# Patient Record
Sex: Male | Born: 1945
Health system: Southern US, Community
[De-identification: ages and names within clinical notes are randomized; demographics above are authoritative.]

## PROBLEM LIST (undated history)

## (undated) DIAGNOSIS — K219 Gastro-esophageal reflux disease without esophagitis: Secondary | ICD-10-CM

## (undated) DIAGNOSIS — H9190 Unspecified hearing loss, unspecified ear: Secondary | ICD-10-CM

## (undated) DIAGNOSIS — K449 Diaphragmatic hernia without obstruction or gangrene: Secondary | ICD-10-CM

## (undated) DIAGNOSIS — I10 Essential (primary) hypertension: Secondary | ICD-10-CM

## (undated) DIAGNOSIS — D509 Iron deficiency anemia, unspecified: Secondary | ICD-10-CM

## (undated) DIAGNOSIS — C61 Malignant neoplasm of prostate: Secondary | ICD-10-CM

## (undated) DIAGNOSIS — E119 Type 2 diabetes mellitus without complications: Secondary | ICD-10-CM

## (undated) DIAGNOSIS — E785 Hyperlipidemia, unspecified: Secondary | ICD-10-CM

## (undated) DIAGNOSIS — K2270 Barrett's esophagus without dysplasia: Secondary | ICD-10-CM

## (undated) HISTORY — DX: Essential (primary) hypertension: I10

## (undated) HISTORY — DX: Unspecified hearing loss, unspecified ear: H91.90

## (undated) HISTORY — DX: Iron deficiency anemia, unspecified: D50.9

## (undated) HISTORY — PX: PROSTATE SURGERY: SHX751

## (undated) HISTORY — DX: Barrett's esophagus without dysplasia: K22.70

## (undated) HISTORY — DX: Diaphragmatic hernia without obstruction or gangrene: K44.9

## (undated) HISTORY — DX: Type 2 diabetes mellitus without complications: E11.9

## (undated) HISTORY — DX: Malignant neoplasm of prostate: C61

## (undated) HISTORY — DX: Hyperlipidemia, unspecified: E78.5

## (undated) HISTORY — PX: COLONOSCOPY: SHX174

---

## 2005-01-19 ENCOUNTER — Ambulatory Visit (HOSPITAL_COMMUNITY): Admission: RE | Admit: 2005-01-19 | Discharge: 2005-01-19 | Payer: Self-pay | Admitting: Urology

## 2005-04-26 ENCOUNTER — Ambulatory Visit (HOSPITAL_COMMUNITY): Admission: RE | Admit: 2005-04-26 | Discharge: 2005-04-26 | Payer: Self-pay | Admitting: Urology

## 2006-04-30 ENCOUNTER — Ambulatory Visit (HOSPITAL_COMMUNITY): Admission: RE | Admit: 2006-04-30 | Discharge: 2006-04-30 | Payer: Self-pay | Admitting: Urology

## 2006-06-13 ENCOUNTER — Encounter: Payer: Self-pay | Admitting: Gastroenterology

## 2006-09-27 ENCOUNTER — Encounter (HOSPITAL_COMMUNITY): Admission: RE | Admit: 2006-09-27 | Discharge: 2006-09-27 | Payer: Self-pay | Admitting: Urology

## 2006-10-30 ENCOUNTER — Encounter (HOSPITAL_COMMUNITY): Admission: RE | Admit: 2006-10-30 | Discharge: 2007-01-15 | Payer: Self-pay | Admitting: Urology

## 2007-05-28 ENCOUNTER — Ambulatory Visit (HOSPITAL_BASED_OUTPATIENT_CLINIC_OR_DEPARTMENT_OTHER): Admission: RE | Admit: 2007-05-28 | Discharge: 2007-05-28 | Payer: Self-pay | Admitting: Urology

## 2008-02-27 ENCOUNTER — Encounter (HOSPITAL_COMMUNITY): Admission: RE | Admit: 2008-02-27 | Discharge: 2008-05-27 | Payer: Self-pay | Admitting: Urology

## 2008-04-28 IMAGING — CT NM TUMOR LOCAL/TRACER DISTR WHOLE BODY 2+ DAYS
1 series · 14 of 16 positions shown, 18 images · non-contrast
Comparison: Comparison is made with bone scan 09/27/06.

CLINICAL DATA: Rising PSA.
NM PROSTATE TUMOR LOCALIZATION (SINGLE DAY PROSTASCINT IMAGING) WITH SPECT:
TECHNIQUE: 5.1 mCi of 4ndium-HHH labeled ProstaScint was administered intravenously. Routine bowel preparation was performed prior to imaging. On day 5, whole-body static imaging and SPECT imaging of the pelvis in the coronal, sagittal and axial planes was performed. A nondiagnostic CT scan was obtained for anatomic localization only of the abdomen and pelvis. The SPECT images were then fused with CT data using [employer] Stamps software.

[Series 2: ct images · axial · 0.98mm/px · z∈[-412,-29]mm · 14 of 135 slices shown, 18 images]
[im 9/135  soft-tissue]
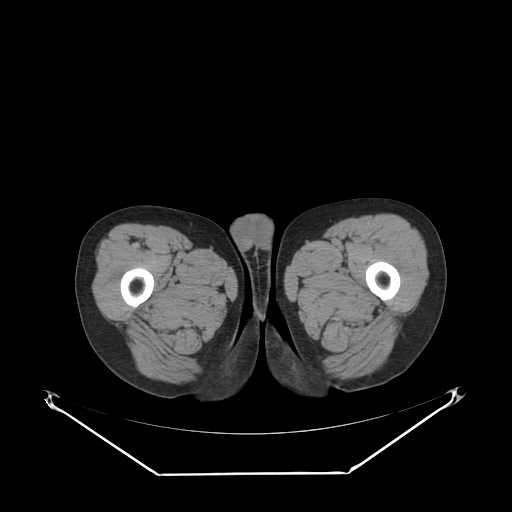
[im 9/135  bone]
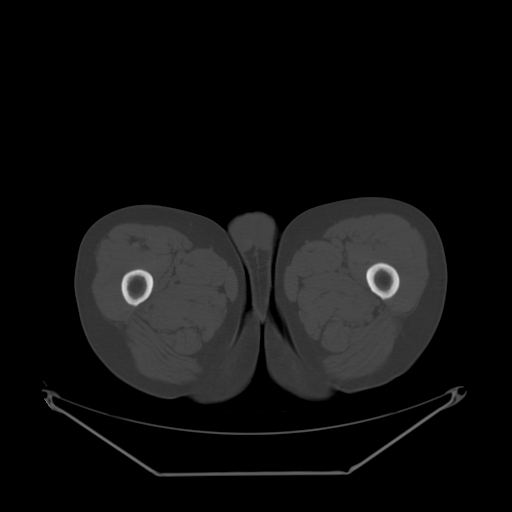
[im 18/135  soft-tissue]
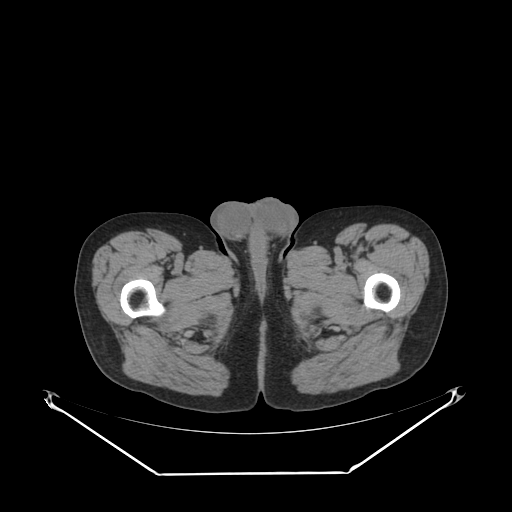
[im 27/135  soft-tissue]
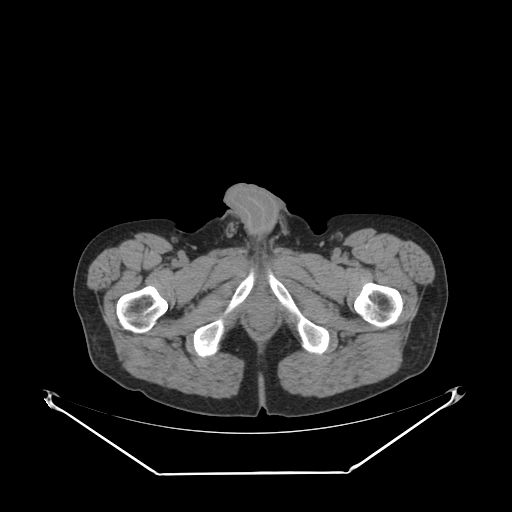
[im 36/135  soft-tissue]
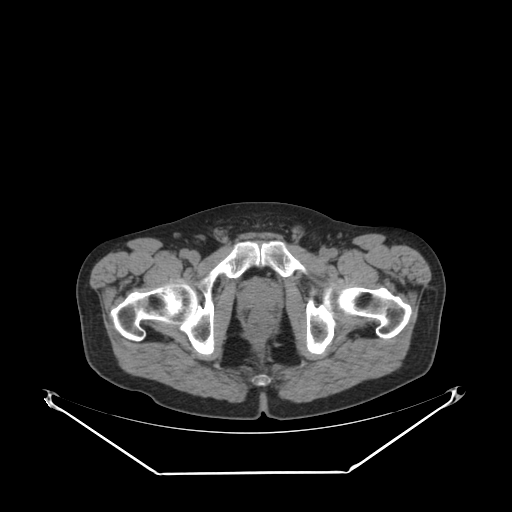
[im 45/135  soft-tissue]
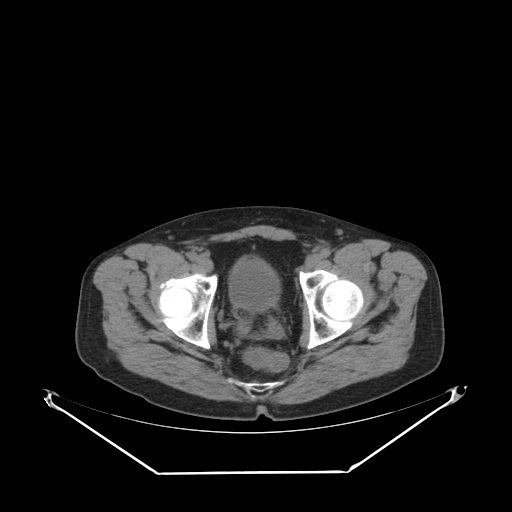
[im 45/135  bone]
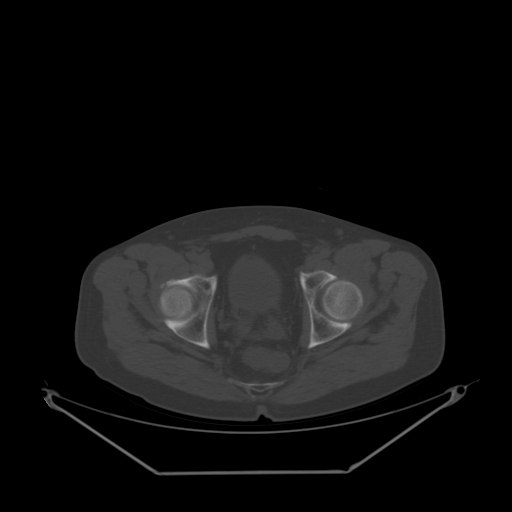
[im 54/135  soft-tissue]
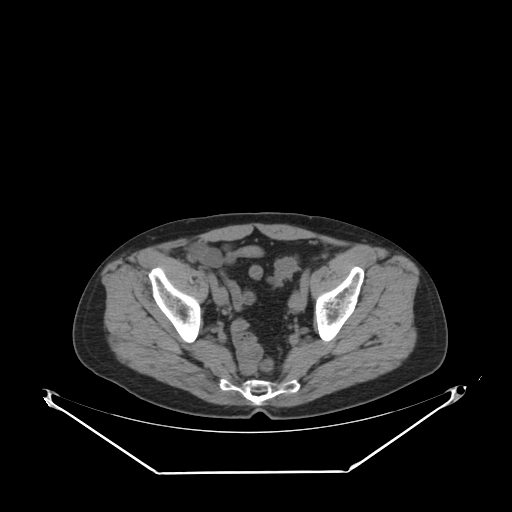
[im 63/135  soft-tissue]
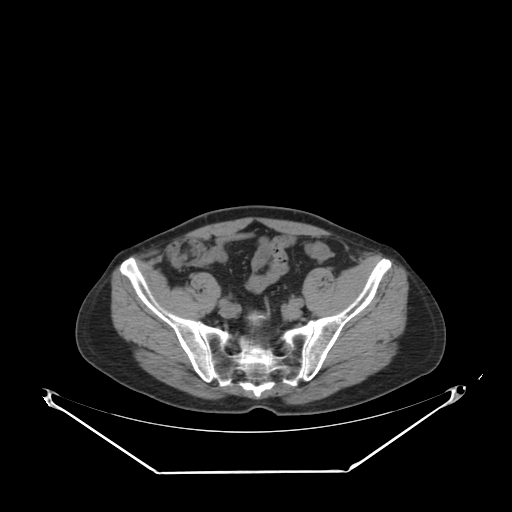
[im 72/135  soft-tissue]
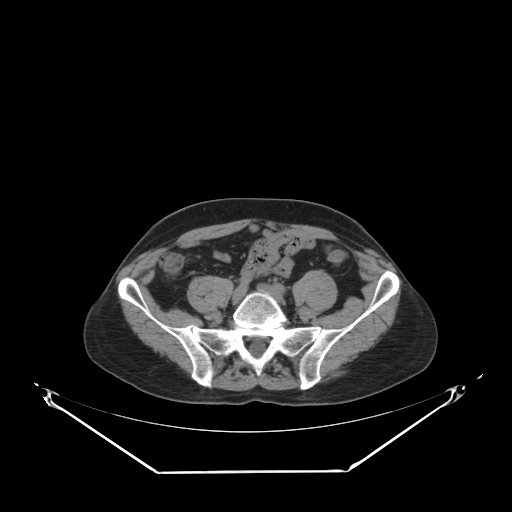
[im 81/135  soft-tissue]
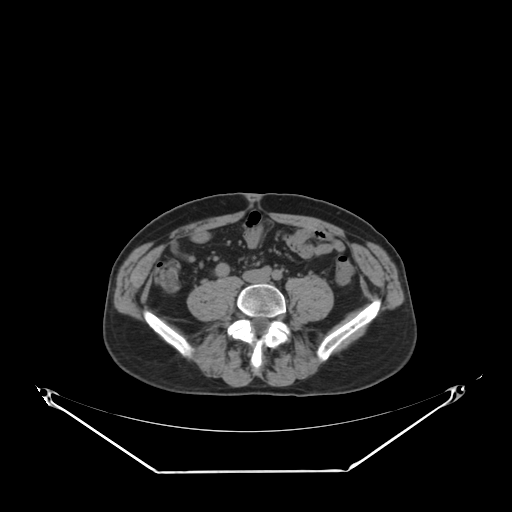
[im 81/135  bone]
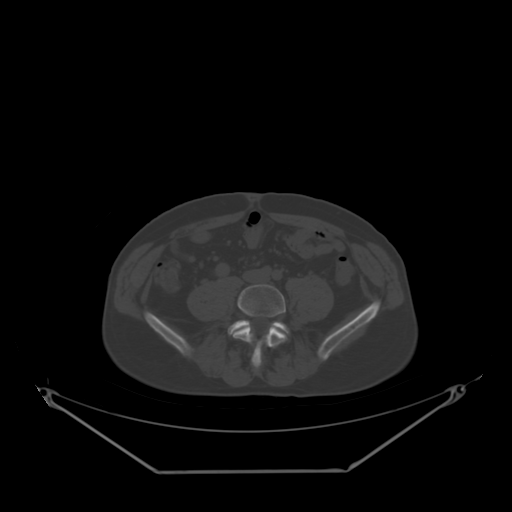
[im 90/135  soft-tissue]
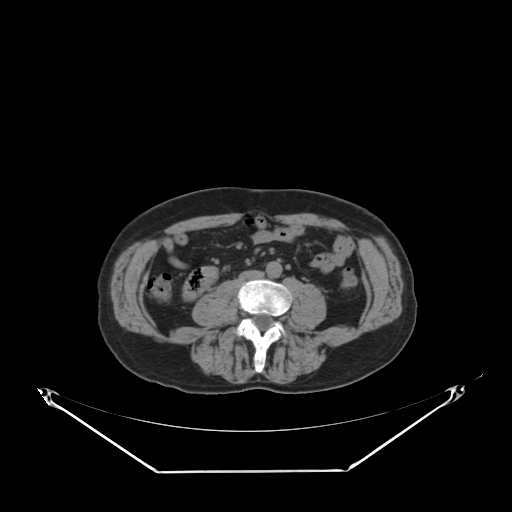
[im 99/135  soft-tissue]
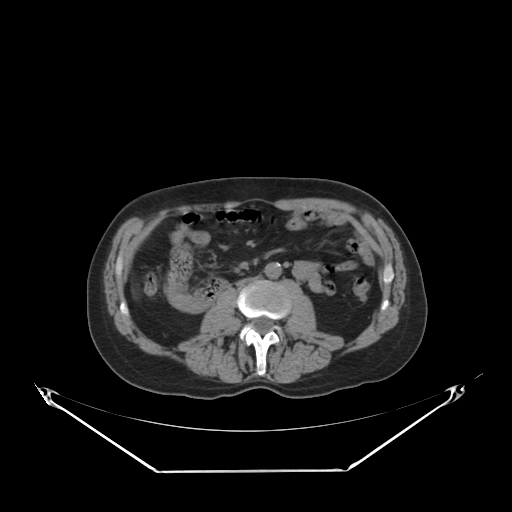
[im 108/135  soft-tissue]
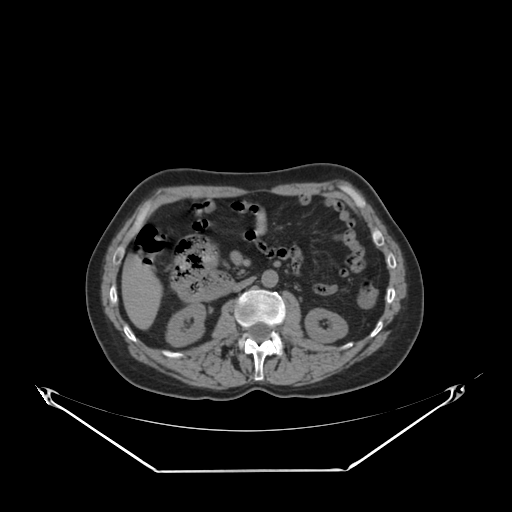
[im 117/135  soft-tissue]
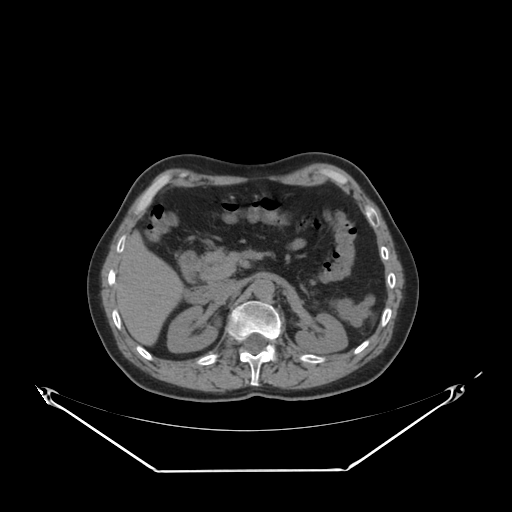
[im 117/135  bone]
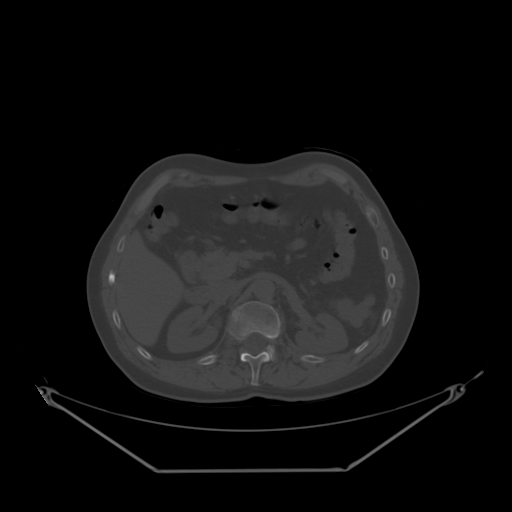
[im 126/135  soft-tissue]
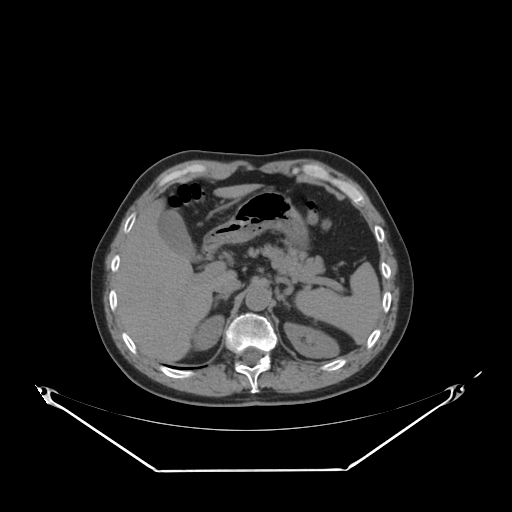

[14 of 16 positions shown; findings below may reference images not displayed]

FINDINGS: The noncontrast CT portion of the examination performed for anatomic localization was reviewed. There is a low-density lesion in the right lobe of liver, which is nonspecific and too small to characterize. There is no pathologically enlarged retroperitoneal lymph nodes. No small bowel mesenteric lymphadenopathy is noted. No enlarged pelvic lymph nodes are identified. There are subcentimeter bilateral inguinal lymph nodes, for example, a left inguinal lymph node has a fatty hilum measuring 9.6 x 10.1 mm. The bone windows shows no suspicious lytic or sclerotic lesions. 
The anterior and posterior whole body nuclear medicine planar images show normal physiologic distribution of the radiotracer. 
There is mild increased radiotracer activity within the prostate gland and base of penis. There is no evidence for extraprostatic sites of Prostascint activity.
IMPRESSION: No extraprostatic sites of Prostascint activity to suggest Prostascint avid metastasis.

## 2009-09-14 ENCOUNTER — Ambulatory Visit: Payer: Self-pay | Admitting: Gastroenterology

## 2009-09-14 ENCOUNTER — Encounter (INDEPENDENT_AMBULATORY_CARE_PROVIDER_SITE_OTHER): Payer: Self-pay | Admitting: *Deleted

## 2009-09-14 DIAGNOSIS — K2270 Barrett's esophagus without dysplasia: Secondary | ICD-10-CM | POA: Insufficient documentation

## 2009-09-28 ENCOUNTER — Ambulatory Visit: Payer: Self-pay | Admitting: Gastroenterology

## 2009-10-04 ENCOUNTER — Encounter: Payer: Self-pay | Admitting: Gastroenterology

## 2010-11-26 ENCOUNTER — Encounter: Payer: Self-pay | Admitting: Urology

## 2011-03-20 NOTE — Op Note (Signed)
[name], [name]               ACCOUNT NO.:  [id number]   MEDICAL RECORD NO.:  [id number]          PATIENT TYPE:  AMB   LOCATION:  [facility]                         FACILITY:  [facility]   PHYSICIAN:  Lindaann Slough, M.D.  DATE OF BIRTH:  05/09/1946   DATE OF PROCEDURE:  05/28/2007  DATE OF DISCHARGE:                               OPERATIVE REPORT   PREOPERATIVE DIAGNOSIS:  Adenocarcinoma of prostate, status post  cryoablation of the prostate.   POSTOPERATIVE DIAGNOSES:  Adenocarcinoma of prostate, status post  cryoablation of the prostate.   PROCEDURE:  Cystoscopy and repeat cryoablation of the prostate.   SURGEON:  Danae Chen, M.D.   ANESTHESIA:  General.   INDICATIONS:  The patient is a 65 year old male who had cryoablation of  the prostate on April 26, 2005.  PSA at the time of diagnosis was 8.38  and went down to 2.46 in December 2006 and it has since been slowly  going up.  Prostascint scan December 2007 showed no evidence of  extraprostatic activities.  Bone scan was negative for metastatic  disease.  Repeat prostate biopsy on February 28, 2007 showed adenocarcinoma  of the prostate, Gleason score 4 + 4.  The patient received an injection  of Lupron and treatment options were discussed again with him.  Those  options include repeat cryoablation, combination external beam and  brachytherapy or hormonal treatment.  He chose to have repeat  cryoablation.  He is scheduled today for the procedure.   DESCRIPTION OF PROCEDURE:  Under general anesthesia the patient was  prepped and draped and placed in the dorsal lithotomy position.  A #16  Foley catheter was inserted in the bladder and then the transducer was  inserted in the rectum.  The volume of the prostate is about 16 mL.  The  grid was then attached to the transducer then five ice rods were placed  in the prostate, one at the C2, another one at E2 level.  The other  three were placed, one at the C1-1/2, D1-1/2 posteriorly and  one at E1-  1/2.  Two sensor needles were placed, one at the external sphincter and  another one in Denonvilliers' fascia.  Then the Foley catheter was  removed.  A flexible cystoscope was inserted in the bladder.  The  anterior urethra is normal.  There is moderate prostatic hypertrophy.  The bladder is moderately trabeculated.  There is no stone or tumor in  the bladder.  There is no evidence of penetration of the bladder neck  with the ice rods.  Then under direct vision a suprapubic catheter was  inserted in the bladder.  Then the guide wire was passed through the  cystoscope into the bladder and the cystoscope was removed.  The  urethral warmer catheter was passed over the guidewire and the guidewire  was removed and the urethral warmer catheter was secured with towel  clips.  Then the first freeze cycle was started on the anterior needle,  then the ice rods at C1-1/2 and were activated when the ice ball was  close to those needles.  Then  the middle posterior ice rod was then  activated.  The first freeze cycle lasted about 6 minutes and 30-  seconds.  The temperature at the external sphincter was 16 degrees  centigrade and temperature in Denonvilliers fascia was -6 degrees  centigrade.  The temperature of the urethral warmer catheter remained at  43 degrees centigrade.  A nice ice ball was formed throughout the  prostate gland.  Then the thaw cycle was started and lasted about 10  minutes.  Then the second freeze cycle was started at the anterior ice  rods.  Then the posterior ice rods were activated.  The second freeze  cycle lasted 4 minutes and 30 seconds.  The temperature at the external  sphincter was at 16 degrees centigrade and the temperature at the  Denonvilliers fascia was at -20 degrees centigrade.  Again a nice ice  ball was formed throughout the prostate gland and at that point an  active thaw cycle was started.  Then all ice rods were removed and the  sensor needles  were also removed.  The  suprapubic catheter was connected to a drainage bag.  The urethral  warmer catheter was removed after passive thaw cycle about 30 minutes at  the end of the procedure.   The patient tolerated the procedure well and left the OR in satisfactory  condition to post anesthesia care unit.      Lindaann Slough, M.D.  Electronically Signed     MN/MEDQ  D:  05/28/2007  T:  05/29/2007  Job:  [id number]

## 2011-03-23 NOTE — Op Note (Signed)
[name], [name]               ACCOUNT NO.:  [id number]   MEDICAL RECORD NO.:  [id number]          PATIENT TYPE:  AMB   LOCATION:  DAY                          FACILITY:  [facility]   PHYSICIAN:  Lindaann Slough, M.D.  DATE OF BIRTH:  Apr 14, 1946   DATE OF PROCEDURE:  04/26/2005  DATE OF DISCHARGE:                                 OPERATIVE REPORT   PREOPERATIVE DIAGNOSIS:  Adenocarcinoma of prostate, stage T1C.   POSTOPERATIVE DIAGNOSIS:  Adenocarcinoma of prostate, stage T1C.   PROCEDURE:  Cystoscopy, insertion of suprapubic catheter and cryoablation of  prostate.   SURGEON:  Dr. Brunilda Payor.   ANESTHESIA:  General.   INDICATIONS:  The patient is 65 year old male who had an elevated PSA at  8.37. Prostate biopsy was positive for adenocarcinoma, Gleason score 4 +3  (7). Bone scan is negative for metastatic disease. Treatment options were  discussed with the patient and he chose to have cryoablation of the  prostate.  He is scheduled today for the procedure.   Under general anesthesia the patient was prepped and draped and placed in  the dorsolithotomy position. A flexible  cystoscope was inserted in the  bladder. The patient has trilobar prostatic hypertrophy. The bladder mucosa  is normal. There is no stone or tumor in the bladder. The ureteral orifices  are in normal position and shape with clear reflux. Then a #14-French  Microvasive suprapubic catheter was inserted in the bladder. The cystoscope  was then removed and a #16-French Foley catheter was then inserted in the  bladder. Ultrasound  probe was then placed on the stepperdevice. The bladder neck, the urethra  and prostate were well visualized on both sagittal and transverse planes.   Two ice rods were placed on the anterior prostate at C3 and E3 then a second  row of ice rods were placed c2 and E2. Then a third ice rod was placed c1.5  and a fourth one was placed at e1. Then a thermal sensor was placed on the  right  neurovascular bundle and another thermal sensor was placed on the left  neurovascular bundle. The thermal sensor was placed in the Denonvillier's  fascia at the mid prostate in the midline and another sensor was placed at  the area of the urethral sphincter posterior to the urethra. The needles  were visualized on both sagittal and transverse planes.   Foley catheter was then removed. The flexible cystoscope was then reinserted  in the bladder. There was no evidence of urethral or bladder penetration  with the needles and the sphincter could be seen winking from the sphincter  probe. We felt all needles were then in good position. Then a super stiff  Cook wire was passed through the cystoscope into the bladder. The cystoscope  was then removed and the urethral warmer catheter was passed over the guide  wire.   The first freeze cycle was then started on the first row at a flow rate of  100%. An ice ball was then visualized. And then in subsequent fashion row 2,  3, and 4 were then started. The first freeze  time for group one was 10  minutes and eight and half minutes for the second group and 3 minutes for  the third group and 3 minutes for the fourth group. There was good freezing  of the prostate. The temperature at the external sphincter was -11C but it  is felt that the needle was more into the prostate than at the external  sphincter.  The temperature at the right neurovascular bundle was 19.  The  Denonvillier's fascia 6 degrees and 21 for the left neurovascular bundle.  Then an active thaw  was performed for 10 minutes.  Then the second freeze  cycle was started beginning with the first group.  The freeze time was six  minutes and 30 seconds for the groups 1 and 2 and three minutes for groups 3  and 4. The temperature at the right neurovascular bundle was 26 degrees, 24  at the left neurovascular bundle, 4 at Denonvillier's fascia and 0 at the  external sphincter.  Following this, a  second thaw was then performed for 10  minutes.  And then all needles were removed.  The urethral warmer catheter  was left in place for 40 minutes.  Pressure was held over the perineum, was  held for 10 minutes.  Then a pressure dressing was applied on the perineum.   The patient tolerated the procedure well and left the OR in satisfactory  condition to post anesthesia care unit.       MN/MEDQ  D:  04/26/2005  T:  04/26/2005  Job:  [id number]

## 2011-07-06 ENCOUNTER — Other Ambulatory Visit (HOSPITAL_COMMUNITY): Payer: Self-pay | Admitting: Urology

## 2011-07-06 DIAGNOSIS — C61 Malignant neoplasm of prostate: Secondary | ICD-10-CM

## 2011-07-24 ENCOUNTER — Ambulatory Visit (HOSPITAL_COMMUNITY)
Admission: RE | Admit: 2011-07-24 | Discharge: 2011-07-24 | Disposition: A | Discharge status: Home / Self Care | Payer: Medicare Other | Source: Ambulatory Visit | Attending: Urology | Admitting: Urology

## 2011-07-24 ENCOUNTER — Encounter (HOSPITAL_COMMUNITY)
Admission: RE | Admit: 2011-07-24 | Discharge: 2011-07-24 | Disposition: A | Discharge status: Home / Self Care | Payer: Medicare Other | Source: Ambulatory Visit | Attending: Urology | Admitting: Urology

## 2011-07-24 DIAGNOSIS — C61 Malignant neoplasm of prostate: Secondary | ICD-10-CM

## 2011-07-24 MED ORDER — TECHNETIUM TC 99M MEDRONATE IV KIT
25.4000 | PACK | Freq: Once | INTRAVENOUS | Status: AC | PRN
  Administered 2011-07-24: 25 via INTRAVENOUS

## 2011-08-20 LAB — COMPREHENSIVE METABOLIC PANEL
ALT: 35
Alkaline Phosphatase: 73
BUN: 12
GFR calc Af Amer: >60
GFR calc non Af Amer: >60
Glucose, Bld: 96
Potassium: 3.8
Total Bilirubin: 1.2

## 2011-08-20 LAB — CBC
HCT: 44.2
Platelets: 182

## 2011-08-20 LAB — PROTIME-INR
INR: 1
Prothrombin Time: 13

## 2012-03-05 ENCOUNTER — Other Ambulatory Visit (HOSPITAL_COMMUNITY): Payer: Self-pay | Admitting: Urology

## 2012-03-05 DIAGNOSIS — C61 Malignant neoplasm of prostate: Secondary | ICD-10-CM

## 2012-03-14 ENCOUNTER — Other Ambulatory Visit (HOSPITAL_COMMUNITY): Payer: Medicare Other

## 2012-03-17 ENCOUNTER — Encounter (HOSPITAL_COMMUNITY)
Admission: RE | Admit: 2012-03-17 | Discharge: 2012-03-17 | Disposition: A | Discharge status: Home / Self Care | Payer: Medicare Other | Source: Ambulatory Visit | Attending: Urology | Admitting: Urology

## 2012-03-17 DIAGNOSIS — C61 Malignant neoplasm of prostate: Secondary | ICD-10-CM | POA: Insufficient documentation

## 2012-03-17 DIAGNOSIS — Z006 Encounter for examination for normal comparison and control in clinical research program: Secondary | ICD-10-CM | POA: Insufficient documentation

## 2012-03-17 MED ORDER — FLUDEOXYGLUCOSE F - 18 (FDG) INJECTION: 9.9000 | Freq: Once | INTRAVENOUS | Status: AC | PRN

## 2012-08-18 ENCOUNTER — Encounter: Payer: Self-pay | Admitting: Gastroenterology

## 2012-11-14 ENCOUNTER — Other Ambulatory Visit (HOSPITAL_COMMUNITY): Payer: Self-pay | Admitting: Urology

## 2012-11-14 DIAGNOSIS — C61 Malignant neoplasm of prostate: Secondary | ICD-10-CM

## 2012-11-25 ENCOUNTER — Ambulatory Visit: Payer: Self-pay

## 2012-11-25 DIAGNOSIS — Z139 Encounter for screening, unspecified: Secondary | ICD-10-CM

## 2012-11-25 NOTE — Progress Notes (Signed)
[facility] EKG 

## 2012-11-26 ENCOUNTER — Encounter (HOSPITAL_COMMUNITY)
Admission: RE | Admit: 2012-11-26 | Discharge: 2012-11-26 | Disposition: A | Discharge status: Home / Self Care | Payer: Medicare PPO | Source: Ambulatory Visit | Attending: Urology | Admitting: Urology

## 2012-11-26 ENCOUNTER — Other Ambulatory Visit (HOSPITAL_COMMUNITY): Payer: Self-pay | Admitting: Urology

## 2012-11-26 DIAGNOSIS — Z006 Encounter for examination for normal comparison and control in clinical research program: Secondary | ICD-10-CM | POA: Insufficient documentation

## 2012-11-26 DIAGNOSIS — C61 Malignant neoplasm of prostate: Secondary | ICD-10-CM | POA: Insufficient documentation

## 2012-11-26 MED ORDER — TECHNETIUM TC 99M MEDRONATE IV KIT
25.0000 | PACK | Freq: Once | INTRAVENOUS | Status: AC | PRN
  Administered 2012-11-26: 25 via INTRAVENOUS

## 2012-12-05 ENCOUNTER — Encounter: Payer: Medicare PPO | Admitting: Cardiology

## 2012-12-05 DIAGNOSIS — Z006 Encounter for examination for normal comparison and control in clinical research program: Secondary | ICD-10-CM

## 2012-12-05 NOTE — Patient Instructions (Signed)
[facility] EKG 

## 2012-12-05 NOTE — Progress Notes (Signed)
[facility] EKG 

## 2013-01-12 ENCOUNTER — Other Ambulatory Visit: Payer: Self-pay | Admitting: Urology

## 2013-01-12 DIAGNOSIS — C61 Malignant neoplasm of prostate: Secondary | ICD-10-CM

## 2013-02-25 ENCOUNTER — Encounter (HOSPITAL_COMMUNITY)
Admission: RE | Admit: 2013-02-25 | Discharge: 2013-02-25 | Disposition: A | Discharge status: Home / Self Care | Payer: Medicare PPO | Source: Ambulatory Visit | Attending: Urology | Admitting: Urology

## 2013-02-25 DIAGNOSIS — C61 Malignant neoplasm of prostate: Secondary | ICD-10-CM | POA: Insufficient documentation

## 2013-02-25 MED ORDER — TECHNETIUM TC 99M MEDRONATE IV KIT
25.0000 | PACK | Freq: Once | INTRAVENOUS | Status: AC | PRN
  Administered 2013-02-25: 25 via INTRAVENOUS

## 2013-03-02 ENCOUNTER — Encounter: Payer: Self-pay | Admitting: Gastroenterology

## 2013-03-09 ENCOUNTER — Other Ambulatory Visit (HOSPITAL_COMMUNITY): Payer: Self-pay | Admitting: Urology

## 2013-03-09 DIAGNOSIS — C61 Malignant neoplasm of prostate: Secondary | ICD-10-CM

## 2013-05-27 ENCOUNTER — Encounter (HOSPITAL_COMMUNITY)
Admission: RE | Admit: 2013-05-27 | Discharge: 2013-05-27 | Disposition: A | Discharge status: Home / Self Care | Payer: Medicare PPO | Source: Ambulatory Visit | Attending: Urology | Admitting: Urology

## 2013-05-27 DIAGNOSIS — C61 Malignant neoplasm of prostate: Secondary | ICD-10-CM | POA: Insufficient documentation

## 2013-05-27 DIAGNOSIS — R972 Elevated prostate specific antigen [PSA]: Secondary | ICD-10-CM | POA: Insufficient documentation

## 2013-05-27 MED ORDER — TECHNETIUM TC 99M MEDRONATE IV KIT
25.0000 | PACK | Freq: Once | INTRAVENOUS | Status: AC | PRN
  Administered 2013-05-27: 25 via INTRAVENOUS

## 2013-06-10 ENCOUNTER — Other Ambulatory Visit (HOSPITAL_COMMUNITY): Payer: Self-pay | Admitting: Urology

## 2013-06-10 DIAGNOSIS — C61 Malignant neoplasm of prostate: Secondary | ICD-10-CM

## 2013-08-17 ENCOUNTER — Ambulatory Visit (HOSPITAL_COMMUNITY): Payer: Medicare PPO

## 2013-08-17 ENCOUNTER — Encounter (HOSPITAL_COMMUNITY): Payer: Medicare PPO

## 2013-08-18 ENCOUNTER — Encounter (HOSPITAL_COMMUNITY): Payer: Medicare PPO

## 2013-08-18 ENCOUNTER — Encounter (HOSPITAL_COMMUNITY)
Admission: RE | Admit: 2013-08-18 | Discharge: 2013-08-18 | Disposition: A | Discharge status: Home / Self Care | Payer: Medicare PPO | Source: Ambulatory Visit | Attending: Urology | Admitting: Urology

## 2013-08-18 ENCOUNTER — Ambulatory Visit (HOSPITAL_COMMUNITY): Payer: Medicare PPO

## 2013-08-18 DIAGNOSIS — C61 Malignant neoplasm of prostate: Secondary | ICD-10-CM | POA: Insufficient documentation

## 2013-08-18 DIAGNOSIS — M4120 Other idiopathic scoliosis, site unspecified: Secondary | ICD-10-CM | POA: Insufficient documentation

## 2013-08-18 MED ORDER — TECHNETIUM TC 99M MEDRONATE IV KIT
25.0000 | PACK | Freq: Once | INTRAVENOUS | Status: AC | PRN
  Administered 2013-08-18: 25 via INTRAVENOUS

## 2013-08-20 ENCOUNTER — Other Ambulatory Visit (HOSPITAL_COMMUNITY): Payer: Self-pay | Admitting: Urology

## 2013-08-20 DIAGNOSIS — C61 Malignant neoplasm of prostate: Secondary | ICD-10-CM

## 2013-11-05 HISTORY — PX: COLONOSCOPY: SHX174

## 2013-11-10 ENCOUNTER — Encounter (HOSPITAL_COMMUNITY)
Admission: RE | Admit: 2013-11-10 | Discharge: 2013-11-10 | Disposition: A | Discharge status: Home / Self Care | Payer: Medicare PPO | Source: Ambulatory Visit | Attending: Urology | Admitting: Urology

## 2013-11-10 DIAGNOSIS — M4120 Other idiopathic scoliosis, site unspecified: Secondary | ICD-10-CM | POA: Insufficient documentation

## 2013-11-10 DIAGNOSIS — C61 Malignant neoplasm of prostate: Secondary | ICD-10-CM | POA: Insufficient documentation

## 2013-11-10 MED ORDER — TECHNETIUM TC 99M MEDRONATE IV KIT
25.8000 | PACK | Freq: Once | INTRAVENOUS | Status: AC | PRN
  Administered 2013-11-10: 25.8 via INTRAVENOUS

## 2013-11-18 ENCOUNTER — Other Ambulatory Visit (HOSPITAL_COMMUNITY): Payer: Self-pay | Admitting: Urology

## 2013-11-18 DIAGNOSIS — C61 Malignant neoplasm of prostate: Secondary | ICD-10-CM

## 2013-12-25 ENCOUNTER — Encounter (HOSPITAL_COMMUNITY): Payer: Self-pay | Admitting: Emergency Medicine

## 2013-12-25 ENCOUNTER — Emergency Department (HOSPITAL_COMMUNITY)
Admission: EM | Admit: 2013-12-25 | Discharge: 2013-12-25 | Disposition: A | Discharge status: Home / Self Care | Payer: Medicare PPO | Attending: Emergency Medicine | Admitting: Emergency Medicine

## 2013-12-25 ENCOUNTER — Emergency Department (HOSPITAL_COMMUNITY): Payer: Medicare PPO

## 2013-12-25 DIAGNOSIS — S52609A Unspecified fracture of lower end of unspecified ulna, initial encounter for closed fracture: Principal | ICD-10-CM

## 2013-12-25 DIAGNOSIS — Z79899 Other long term (current) drug therapy: Secondary | ICD-10-CM | POA: Insufficient documentation

## 2013-12-25 DIAGNOSIS — S52502A Unspecified fracture of the lower end of left radius, initial encounter for closed fracture: Secondary | ICD-10-CM

## 2013-12-25 DIAGNOSIS — W010XXA Fall on same level from slipping, tripping and stumbling without subsequent striking against object, initial encounter: Secondary | ICD-10-CM | POA: Insufficient documentation

## 2013-12-25 DIAGNOSIS — Z7982 Long term (current) use of aspirin: Secondary | ICD-10-CM | POA: Insufficient documentation

## 2013-12-25 DIAGNOSIS — Y929 Unspecified place or not applicable: Secondary | ICD-10-CM | POA: Insufficient documentation

## 2013-12-25 DIAGNOSIS — S52602A Unspecified fracture of lower end of left ulna, initial encounter for closed fracture: Secondary | ICD-10-CM

## 2013-12-25 DIAGNOSIS — S52509A Unspecified fracture of the lower end of unspecified radius, initial encounter for closed fracture: Secondary | ICD-10-CM | POA: Insufficient documentation

## 2013-12-25 DIAGNOSIS — Y939 Activity, unspecified: Secondary | ICD-10-CM | POA: Insufficient documentation

## 2013-12-25 MED ORDER — OXYCODONE-ACETAMINOPHEN 5-325 MG PO TABS
2.0000 | ORAL_TABLET | Freq: Once | ORAL | Status: AC
  Administered 2013-12-25: 2 via ORAL
  Filled 2013-12-25: qty 2

## 2013-12-25 MED ORDER — OXYCODONE-ACETAMINOPHEN 5-325 MG PO TABS: ORAL_TABLET | ORAL | Status: DC

## 2013-12-25 NOTE — Progress Notes (Signed)
Orthopedic Tech Progress Note Patient Details:  [name] Jun 01, 1946 [id number]  Ortho Devices Type of Ortho Device: Ace wrap;Sugartong splint Ortho Device/Splint Location: lue Ortho Device/Splint Interventions: Application Wrist reduction; as ordered by Dr. Barnabas Harries, Maily Debarge 12/25/2013, 6:56 PM

## 2013-12-25 NOTE — Consult Note (Signed)
Reason for Consult:Left distal radius and ulna fracture Referring Physician: Dr. Mingo Amber  The history is provided by the patient. A language interpreter was used (sign language).   HPI Comments:  Wilbert Hayashi is a 68 y.o. male who presents to the Emergency Department complaining of left wrist injury. Pt states he slipped on the ice earlier and landed on his wrist. Denies hitting his head or LOC. He has sudden onset left wrist pain with associated swelling. Pt was sent here from urgent care for treatment. An xray was done and he has a displaced fracture. Denies numbness, elbow pain, shoulder pain.    History reviewed. No pertinent past medical history.  History reviewed. No pertinent past surgical history.  No family history on file.  Social History:  reports that he has never smoked. He does not have any smokeless tobacco history on file. He reports that he does not drink alcohol. His drug history is not on file.  Allergies: No Known Allergies  Medications: I have reviewed the patient's current medications.  No results found for this or any previous visit (from the past 48 hour(s)).  No results found.  ROS NO RECENT ILLNESSES OR HOSPITALIZATIONS Blood pressure 144/68, pulse 74, temperature 98.7 F (37.1 C), temperature source Oral, resp. rate 20, weight 77.111 kg (170 lb), SpO2 98.00%. Physical Exam Physical Exam  Nursing note and vitals reviewed.  Constitutional: He is oriented to person, place, and time. He appears well-developed and well-nourished.  HENT:  Head: Normocephalic and atraumatic.  Eyes: EOM are normal.  Neck: Normal range of motion.  Cardiovascular: Normal rate.  Pulmonary/Chest: Effort normal.  Musculoskeletal: LEFT WRIST SKIN INTACT FINGERS WARM WELL PERFUSED. ABLE TO EXTEND THUMB AND DIGITS, LIMITED WRIST MOBILITY AND FOREARM ROTATION +GROSS DEFORMITY TO LEFT WRIST. Neurological: He is alert and oriented to person, place, and time.  Skin: Skin is warm and  dry.  Psychiatric: He has a normal mood and affect. His behavior is normal.    Assessment/Plan: LEFT WRIST COMMINUTED DISPLACED ANGULATED INTRAARTICULAR DISTAL RADIUS FRACTURE AND ULNA FRACTURE  CLOSED MANIPULATION PERFORMED UNDER HEMATOMA BLOCK AND SUGARTONG SPLINT APPLIED POST REDUCTION RADIOGRAPHS ORDERED ICE/ELEVATION PAIN MEDICATIONS SLING FOR COMFORT  WILL LIKELY REQUIRE SURGICAL STABILIZATION WILL D/W PATIENT ABOUT SURGERY THIS WEEKEND OR RETURNING TO OFFICE AND SCHEDULING AS OUTPATIENT. INTERPRETER AND WIFE AT BEDSIDE  PT TOLERATED MANIPULATION AND UNDERSTOOD REASON FOR PROCEDURE TO CORRECT DEFORMITY OF LEFT ARM AND WRIST.  Linna Hoff 12/25/2013, 6:47 PM

## 2013-12-25 NOTE — ED Notes (Signed)
An intrepreter has been called for this pt... 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  Thew pt [facility] not come from [facility] came from [facility] [address]

## 2013-12-25 NOTE — Discharge Instructions (Signed)
Cast or Splint Care Casts and splints support injured limbs and keep bones from moving while they heal.  HOME CARE  Keep the cast or splint uncovered during the drying period.  A plaster cast can take 24 to 48 hours to dry.  A fiberglass cast will dry in less than 1 hour.  Do not rest the cast on anything harder than a pillow for 24 hours.  Do not put weight on your injured limb. Do not put pressure on the cast. Wait for your doctor's approval.  Keep the cast or splint dry.  Cover the cast or splint with a plastic bag during baths or wet weather.  If you have a cast over your chest and belly (trunk), take sponge baths until the cast is taken off.  If your cast gets wet, dry it with a towel or blow dryer. Use the cool setting on the blow dryer.  Keep your cast or splint clean. Wash a dirty cast with a damp cloth.  Do not put any objects under your cast or splint.  Do not scratch the skin under the cast with an object. If itching is a problem, use a blow dryer on a cool setting over the itchy area.  Do not trim or cut your cast.  Do not take out the padding from inside your cast.  Exercise your joints near the cast as told by your doctor.  Raise (elevate) your injured limb on 1 or 2 pillows for the first 1 to 3 days. GET HELP IF:  Your cast or splint cracks.  Your cast or splint is too tight or too loose.  You itch badly under the cast.  Your cast gets wet or has a soft spot.  You have a bad smell coming from the cast.  You get an object stuck under the cast.  Your skin around the cast becomes red or sore.  You have new or more pain after the cast is put on. GET HELP RIGHT AWAY IF:  You have fluid leaking through the cast.  You cannot move your fingers or toes.  Your fingers or toes turn blue or white or are cool, painful, or puffy (swollen).  You have tingling or lose feeling (numbness) around the injured area.  You have bad pain or pressure under the  cast.  You have trouble breathing or have shortness of breath.  You have chest pain. Document Released: 02/21/2011 Document Revised: 06/24/2013 Document Reviewed: 04/30/2013 [facility] Patient Information 2014 [place].  Forearm Fracture The forearm is between your elbow and your wrist. It has two bones (ulna and radius). A fracture is a break in one or both of these bones. HOME CARE  Raise (elevate) your arm above the level of the heart.  Put ice on the injured area.  Put ice in a plastic bag.  Place a towel between the skin and the bag.  Leave the ice on for 15-20 minutes, 03-04 times a day.  If given a plaster or fiberglass cast:  Do not try to scratch the skin under the cast with sharp or pointed objects.  Check the skin around the cast every day. You may put lotion on any red or sore areas.  Keep the cast dry and clean.  If given a plaster splint:  Wear the splint as told.  You may loosen the elastic around the splint if the fingers become numb, tingle, or turn cold or blue.  Do not put pressure on any part of  the cast or splint. It may break. Rest the cast only on a pillow the first 24 hours until it is fully hardened.  The cast or splint can be protected during bathing with a plastic bag. Do not lower the cast or splint into water.  Only take medicine as told by your doctor. GET HELP RIGHT AWAY IF:   The cast gets damaged or breaks.  You have pain or puffiness (swelling).  The skin or nails below the injury turn blue or gray, or feel cold or numb.  There is a bad smell, new stains, or fluid coming from under the cast. MAKE SURE YOU:   Understand these instructions.  Will watch your condition.  Will get help right away if you are not doing well or get worse. Document Released: 04/09/2008 Document Revised: 01/14/2012 Document Reviewed: 04/09/2008 [facility] Patient Information 2014 [place], [place].

## 2013-12-25 NOTE — ED Notes (Signed)
PT ambulated with baseline gait; VSS; A&Ox3; no signs of distress; respirations even and unlabored; skin warm and dry; no questions upon discharge.  

## 2013-12-25 NOTE — ED Notes (Signed)
EDP to see.

## 2013-12-25 NOTE — ED Provider Notes (Signed)
CSN: [id number]     Arrival date & time 12/25/13  1711 History  This chart was scribed for non-physician practitioner, Clayton Bibles, PA-C working with Osvaldo Shipper, MD by Frederich Balding, ED scribe. This patient was seen in room TR05C/TR05C and the patient's care was started at 6:05 PM.   Chief Complaint  Patient presents with  . Wrist Injury   The history is provided by the patient. A language interpreter was used (sign language).   HPI Comments: [name] is a 68 y.o. male who presents to the Emergency Department complaining of left wrist injury. Pt states he slipped on the ice earlier and landed on his wrist. Denies hitting his head or LOC. He has sudden onset left wrist pain with associated swelling. Pt was sent here from urgent care for treatment. Denies taking pain medication PTA, however states pain is 1/10. Reports difficulty moving his finger, increased pain.  An xray was done and he has a displaced fracture. Denies numbness, elbow pain, shoulder pain.   History reviewed. No pertinent past medical history. History reviewed. No pertinent past surgical history. No family history on file. History  Substance Use Topics  . Smoking status: Never Smoker   . Smokeless tobacco: Not on file  . Alcohol Use: No    Review of Systems  Musculoskeletal: Positive for arthralgias.  All other systems reviewed and are negative.   Allergies  Review of patient's allergies indicates no known allergies.  Home Medications   Current Outpatient Rx  Name  Route  Sig  Dispense  Refill  . aspirin 81 MG tablet   Oral   Take 81 mg by mouth daily.         Marland Kitchen atorvastatin (LIPITOR) 40 MG tablet               . cholecalciferol (VITAMIN D) 1000 UNITS tablet   Oral   Take 1,000 Units by mouth daily.         . Chromium 1000 MCG TABS   Oral   Take 1,000 mcg by mouth daily.         Marland Kitchen losartan (COZAAR) 100 MG tablet               . Omega-3 Fatty Acids (FISH OIL) 1200 MG CAPS  Oral   Take 1,200 mg by mouth daily.         Marland Kitchen omeprazole (PRILOSEC) 20 MG capsule   Oral   Take 20 mg by mouth daily.         Marland Kitchen oxyCODONE-acetaminophen (PERCOCET/ROXICET) 5-325 MG per tablet      Take 1-2 pills every 4-6 hours as needed for pain.   15 tablet   0     BP 144/68  Pulse 74  Temp(Src) 98.7 F (37.1 C) (Oral)  Resp 20  Wt 170 lb (77.111 kg)  SpO2 98%  Physical Exam  Nursing note and vitals reviewed. Constitutional: He is oriented to person, place, and time. He appears well-developed and well-nourished.  HENT:  Head: Normocephalic and atraumatic.  Eyes: EOM are normal.  Neck: Normal range of motion.  Cardiovascular: Normal rate.   Pulmonary/Chest: Effort normal.  Musculoskeletal: Normal range of motion. He exhibits edema and tenderness.  Obvious deformity of left wrist with moderate edema. Skin in tact. Decreased ROM all 5 digits due to pain.   Radial pulse 2+. Cap refill <2sec. Sensation in tact to light touch.  Neurological: He is alert and oriented to person, place, and time.  Skin: Skin is warm and dry.  Psychiatric: He has a normal mood and affect. His behavior is normal.    ED Course  Procedures (including critical care time)  DIAGNOSTIC STUDIES: Oxygen Saturation is 98% on RA, normal by my interpretation.    COORDINATION OF CARE: 6:10 PM-Discussed treatment plan which includes pain medication with pt at bedside and pt agreed to plan.   Labs Review Labs Reviewed - No data to display Imaging Review Dg Wrist Complete Left  12/25/2013   CLINICAL DATA:  [place] and injured left wrist.  Post reduction.  EXAM: LEFT WRIST - COMPLETE 3+ VIEW  COMPARISON:  None.  FINDINGS: Examination was performed in cast material. Comminuted impacted intra-articular distal radius fracture with only slight dorsal angulation on the postreduction images. Ulnar styloid fracture also noted.  IMPRESSION: 1. Comminuted impacted intra-articular distal radius fracture with only  slight dorsal angulation on the post reduction images. 2. Ulnar styloid fracture.   Electronically Signed   By: Evangeline Dakin M.D.   On: 12/25/2013 19:26    EKG Interpretation   None       MDM   Final diagnoses:  Closed fracture of left distal radius and ulna    Pt presenting from urgent care after slip and fall on ice w/o a comminuted impacted intra-articular distal radius fracture, with dorsal angulation. Pt was sent from UC with a disc that had imaging on the CD, however, software was not available to view from provided disc. Pt's Left hand is neurovascularly in tact. Skin in tact.  Consulted with Dr. Apolonio Schneiders, hand orthopedist, at time of pt arrival as Dr. Apolonio Schneiders was in the ED.  Dr. Apolonio Schneiders examined pt and performed a reduction in the ED. Pt placed in sugar-tong splint.  Post-reduction plain films indicate only slight dorsal angulation.  Pt will be discharged home with pain medication, advised to f/u tomorrow morning, 2/21, for ORIF.  Information provided to pt for calling and arriving in short stay tomorrow.  Advised not to eat or drink after midnight. Pt, wife, and language interpreter acknowledged understanding and agreement with tx plan.   I personally performed the services described in this documentation, which was scribed in my presence. The recorded information has been reviewed and is accurate.   Noland Fordyce, PA-C 12/25/13 2359

## 2013-12-25 NOTE — ED Notes (Signed)
The pt has a wrist injury to the lt wrist.  He fell on the ice earlier today. Swelling of the wrist area. [facility] from [facility] for treatment.  Wedding band removed from the lt ring finger before he cannot get it off.  xrays with him

## 2013-12-26 ENCOUNTER — Encounter (HOSPITAL_COMMUNITY): Payer: Self-pay | Admitting: Certified Registered Nurse Anesthetist

## 2013-12-26 ENCOUNTER — Ambulatory Visit (HOSPITAL_COMMUNITY): Payer: Medicare PPO | Admitting: Certified Registered Nurse Anesthetist

## 2013-12-26 ENCOUNTER — Encounter (HOSPITAL_COMMUNITY): Payer: Medicare PPO | Admitting: Certified Registered Nurse Anesthetist

## 2013-12-26 ENCOUNTER — Encounter (HOSPITAL_COMMUNITY)
Admission: EM | Disposition: A | Discharge status: Home / Self Care | Payer: Self-pay | Source: Ambulatory Visit | Attending: Orthopedic Surgery

## 2013-12-26 ENCOUNTER — Ambulatory Visit (HOSPITAL_COMMUNITY)
Admission: EM | Admit: 2013-12-26 | Discharge: 2013-12-27 | Disposition: A | Discharge status: Home / Self Care | Payer: Medicare PPO | Source: Ambulatory Visit | Attending: Orthopedic Surgery | Admitting: Orthopedic Surgery

## 2013-12-26 ENCOUNTER — Encounter (HOSPITAL_COMMUNITY): Payer: Self-pay | Admitting: Pharmacy Technician

## 2013-12-26 DIAGNOSIS — S52502A Unspecified fracture of the lower end of left radius, initial encounter for closed fracture: Secondary | ICD-10-CM | POA: Diagnosis present

## 2013-12-26 DIAGNOSIS — S52609A Unspecified fracture of lower end of unspecified ulna, initial encounter for closed fracture: Principal | ICD-10-CM

## 2013-12-26 DIAGNOSIS — K219 Gastro-esophageal reflux disease without esophagitis: Secondary | ICD-10-CM | POA: Insufficient documentation

## 2013-12-26 DIAGNOSIS — Z8546 Personal history of malignant neoplasm of prostate: Secondary | ICD-10-CM | POA: Insufficient documentation

## 2013-12-26 DIAGNOSIS — H9190 Unspecified hearing loss, unspecified ear: Secondary | ICD-10-CM | POA: Insufficient documentation

## 2013-12-26 DIAGNOSIS — S52509A Unspecified fracture of the lower end of unspecified radius, initial encounter for closed fracture: Secondary | ICD-10-CM | POA: Insufficient documentation

## 2013-12-26 DIAGNOSIS — W19XXXA Unspecified fall, initial encounter: Secondary | ICD-10-CM | POA: Insufficient documentation

## 2013-12-26 HISTORY — PX: ORIF WRIST FRACTURE: SHX2133

## 2013-12-26 HISTORY — DX: Gastro-esophageal reflux disease without esophagitis: K21.9

## 2013-12-26 LAB — CBC WITH DIFFERENTIAL/PLATELET
Basophils Absolute: 0 10*3/uL (ref 0.0–0.1)
Basophils Relative: 0 % (ref 0–1)
EOS ABS: 0 10*3/uL (ref 0.0–0.7)
EOS PCT: 0 % (ref 0–5)
HCT: 33.3 % — ABNORMAL LOW (ref 39.0–52.0)
Hemoglobin: 10.9 g/dL — ABNORMAL LOW (ref 13.0–17.0)
LYMPHS ABS: 1.2 10*3/uL (ref 0.7–4.0)
LYMPHS PCT: 13 % (ref 12–46)
MCH: 27.6 pg (ref 26.0–34.0)
MCHC: 32.7 g/dL (ref 30.0–36.0)
MCV: 84.3 fL (ref 78.0–100.0)
Monocytes Absolute: 0.7 10*3/uL (ref 0.1–1.0)
Monocytes Relative: 8 % (ref 3–12)
NEUTROS PCT: 78 % — AB (ref 43–77)
Neutro Abs: 6.9 10*3/uL (ref 1.7–7.7)
Platelets: 190 10*3/uL (ref 150–400)
RBC: 3.95 MIL/uL — ABNORMAL LOW (ref 4.22–5.81)
RDW: 14.4 % (ref 11.5–15.5)
WBC: 8.8 10*3/uL (ref 4.0–10.5)

## 2013-12-26 LAB — BASIC METABOLIC PANEL
BUN: 13 mg/dL (ref 6–23)
CHLORIDE: 101 meq/L (ref 96–112)
CO2: 24 meq/L (ref 19–32)
Calcium: 9.5 mg/dL (ref 8.4–10.5)
Creatinine, Ser: 0.95 mg/dL (ref 0.50–1.35)
GFR calc Af Amer: >90 mL/min (ref >90)
GFR calc non Af Amer: 84 mL/min — ABNORMAL LOW (ref >90)
GLUCOSE: 126 mg/dL — AB (ref 70–99)
Potassium: 4 mEq/L (ref 3.7–5.3)
SODIUM: 138 meq/L (ref 137–147)

## 2013-12-26 SURGERY — OPEN REDUCTION INTERNAL FIXATION (ORIF) WRIST FRACTURE
Anesthesia: General | Site: Arm Lower | Laterality: Left

## 2013-12-26 MED ORDER — METHOCARBAMOL 500 MG PO TABS: 500.0000 mg | ORAL_TABLET | Freq: Four times a day (QID) | ORAL | Status: DC

## 2013-12-26 MED ORDER — OXYCODONE-ACETAMINOPHEN 5-325 MG PO TABS
1.0000 | ORAL_TABLET | ORAL | Status: DC | PRN
  Administered 2013-12-26 (×2): 2 via ORAL
  Filled 2013-12-26: qty 2

## 2013-12-26 MED ORDER — METHOCARBAMOL 500 MG PO TABS
500.0000 mg | ORAL_TABLET | Freq: Four times a day (QID) | ORAL | Status: DC | PRN
  Administered 2013-12-26 (×2): 500 mg via ORAL
  Filled 2013-12-26: qty 1

## 2013-12-26 MED ORDER — CEFAZOLIN SODIUM 1-5 GM-% IV SOLN
1.0000 g | INTRAVENOUS | Status: AC
  Administered 2013-12-26: 1 g via INTRAVENOUS
  Filled 2013-12-26: qty 50

## 2013-12-26 MED ORDER — PROPOFOL 10 MG/ML IV BOLUS
INTRAVENOUS | Status: DC | PRN
  Administered 2013-12-26: 100 mg via INTRAVENOUS

## 2013-12-26 MED ORDER — CEFAZOLIN SODIUM 1-5 GM-% IV SOLN
1.0000 g | Freq: Three times a day (TID) | INTRAVENOUS | Status: DC
  Administered 2013-12-27 (×2): 1 g via INTRAVENOUS
  Filled 2013-12-26 (×4): qty 50

## 2013-12-26 MED ORDER — ONDANSETRON HCL 4 MG/2ML IJ SOLN
4.0000 mg | Freq: Once | INTRAMUSCULAR | Status: AC | PRN
  Administered 2013-12-26: 4 mg via INTRAVENOUS

## 2013-12-26 MED ORDER — VITAMIN D3 25 MCG (1000 UNIT) PO TABS
1000.0000 [IU] | ORAL_TABLET | Freq: Every day | ORAL | Status: DC
  Administered 2013-12-26 – 2013-12-27 (×2): 1000 [IU] via ORAL
  Filled 2013-12-26 (×2): qty 1

## 2013-12-26 MED ORDER — VITAMIN C 500 MG PO TABS
1000.0000 mg | ORAL_TABLET | Freq: Every day | ORAL | Status: DC
  Administered 2013-12-26 – 2013-12-27 (×2): 1000 mg via ORAL
  Filled 2013-12-26 (×2): qty 2

## 2013-12-26 MED ORDER — FENTANYL CITRATE 0.05 MG/ML IJ SOLN
INTRAMUSCULAR | Status: DC | PRN
  Administered 2013-12-26 (×7): 25 ug via INTRAVENOUS
  Administered 2013-12-26: 50 ug via INTRAVENOUS
  Administered 2013-12-26 (×3): 25 ug via INTRAVENOUS
  Administered 2013-12-26: 75 ug via INTRAVENOUS
  Administered 2013-12-26: 25 ug via INTRAVENOUS

## 2013-12-26 MED ORDER — OXYCODONE-ACETAMINOPHEN 5-325 MG PO TABS: 1.0000 | ORAL_TABLET | ORAL | Status: DC | PRN

## 2013-12-26 MED ORDER — OXYCODONE-ACETAMINOPHEN 10-325 MG PO TABS: 1.0000 | ORAL_TABLET | ORAL | Status: DC | PRN

## 2013-12-26 MED ORDER — LOSARTAN POTASSIUM 50 MG PO TABS
100.0000 mg | ORAL_TABLET | Freq: Every day | ORAL | Status: DC
  Administered 2013-12-26 – 2013-12-27 (×2): 100 mg via ORAL
  Filled 2013-12-26 (×2): qty 2

## 2013-12-26 MED ORDER — 0.9 % SODIUM CHLORIDE (POUR BTL) OPTIME
TOPICAL | Status: DC | PRN
  Administered 2013-12-26: 1000 mL

## 2013-12-26 MED ORDER — ATORVASTATIN CALCIUM 20 MG PO TABS
20.0000 mg | ORAL_TABLET | Freq: Every day | ORAL | Status: DC
  Administered 2013-12-26: 20 mg via ORAL
  Filled 2013-12-26 (×2): qty 1

## 2013-12-26 MED ORDER — PROPOFOL 10 MG/ML IV BOLUS
INTRAVENOUS | Status: AC
  Filled 2013-12-26: qty 20

## 2013-12-26 MED ORDER — LIDOCAINE HCL (CARDIAC) 20 MG/ML IV SOLN
INTRAVENOUS | Status: AC
  Filled 2013-12-26: qty 5

## 2013-12-26 MED ORDER — PANTOPRAZOLE SODIUM 40 MG PO TBEC
40.0000 mg | DELAYED_RELEASE_TABLET | Freq: Every day | ORAL | Status: DC
  Administered 2013-12-26 – 2013-12-27 (×2): 40 mg via ORAL
  Filled 2013-12-26 (×2): qty 1

## 2013-12-26 MED ORDER — HYDROMORPHONE HCL PF 1 MG/ML IJ SOLN
0.2500 mg | INTRAMUSCULAR | Status: DC | PRN
  Administered 2013-12-26 (×2): 0.5 mg via INTRAVENOUS

## 2013-12-26 MED ORDER — METHOCARBAMOL 100 MG/ML IJ SOLN
500.0000 mg | Freq: Four times a day (QID) | INTRAVENOUS | Status: DC | PRN
  Filled 2013-12-26: qty 5

## 2013-12-26 MED ORDER — LACTATED RINGERS IV SOLN
INTRAVENOUS | Status: DC | PRN
  Administered 2013-12-26 (×2): via INTRAVENOUS

## 2013-12-26 MED ORDER — HYDROCODONE-ACETAMINOPHEN 5-325 MG PO TABS: 1.0000 | ORAL_TABLET | ORAL | Status: DC | PRN

## 2013-12-26 MED ORDER — LIDOCAINE HCL (CARDIAC) 20 MG/ML IV SOLN
INTRAVENOUS | Status: DC | PRN
  Administered 2013-12-26: 100 mg via INTRAVENOUS

## 2013-12-26 MED ORDER — DOCUSATE SODIUM 100 MG PO CAPS
100.0000 mg | ORAL_CAPSULE | Freq: Two times a day (BID) | ORAL | Status: DC
  Administered 2013-12-26 – 2013-12-27 (×2): 100 mg via ORAL
  Filled 2013-12-26 (×2): qty 1

## 2013-12-26 MED ORDER — ADULT MULTIVITAMIN W/MINERALS CH
1.0000 | ORAL_TABLET | Freq: Every day | ORAL | Status: DC
  Administered 2013-12-26 – 2013-12-27 (×2): 1 via ORAL
  Filled 2013-12-26 (×2): qty 1

## 2013-12-26 MED ORDER — CHLORHEXIDINE GLUCONATE 4 % EX LIQD
60.0000 mL | Freq: Once | CUTANEOUS | Status: DC
  Filled 2013-12-26: qty 60

## 2013-12-26 MED ORDER — METHOCARBAMOL 500 MG PO TABS
ORAL_TABLET | ORAL | Status: AC
  Administered 2013-12-26: 500 mg via ORAL
  Filled 2013-12-26: qty 1

## 2013-12-26 MED ORDER — ONDANSETRON HCL 4 MG/2ML IJ SOLN
INTRAMUSCULAR | Status: AC
  Filled 2013-12-26: qty 2

## 2013-12-26 MED ORDER — HYDROMORPHONE HCL PF 1 MG/ML IJ SOLN
INTRAMUSCULAR | Status: AC
  Administered 2013-12-26: 0.5 mg via INTRAVENOUS
  Filled 2013-12-26: qty 1

## 2013-12-26 MED ORDER — FENTANYL CITRATE 0.05 MG/ML IJ SOLN
INTRAMUSCULAR | Status: AC
  Filled 2013-12-26: qty 5

## 2013-12-26 MED ORDER — DIPHENHYDRAMINE HCL 25 MG PO CAPS: 25.0000 mg | ORAL_CAPSULE | Freq: Four times a day (QID) | ORAL | Status: DC | PRN

## 2013-12-26 MED ORDER — ONDANSETRON HCL 4 MG/2ML IJ SOLN: 4.0000 mg | Freq: Four times a day (QID) | INTRAMUSCULAR | Status: DC | PRN

## 2013-12-26 MED ORDER — BUPIVACAINE HCL (PF) 0.25 % IJ SOLN
INTRAMUSCULAR | Status: AC
  Filled 2013-12-26: qty 30

## 2013-12-26 MED ORDER — HYDROMORPHONE HCL PF 1 MG/ML IJ SOLN: 0.5000 mg | INTRAMUSCULAR | Status: DC | PRN

## 2013-12-26 MED ORDER — OXYCODONE-ACETAMINOPHEN 5-325 MG PO TABS
ORAL_TABLET | ORAL | Status: AC
  Administered 2013-12-26: 2 via ORAL
  Filled 2013-12-26: qty 2

## 2013-12-26 MED ORDER — CEFAZOLIN SODIUM-DEXTROSE 2-3 GM-% IV SOLR
2.0000 g | INTRAVENOUS | Status: AC
  Administered 2013-12-26: 2 g via INTRAVENOUS
  Filled 2013-12-26: qty 50

## 2013-12-26 MED ORDER — DOCUSATE SODIUM 100 MG PO CAPS: 100.0000 mg | ORAL_CAPSULE | Freq: Two times a day (BID) | ORAL | Status: DC

## 2013-12-26 MED ORDER — MIDAZOLAM HCL 2 MG/2ML IJ SOLN
INTRAMUSCULAR | Status: AC
  Filled 2013-12-26: qty 2

## 2013-12-26 MED ORDER — VITAMIN C 500 MG PO TABS: 500.0000 mg | ORAL_TABLET | Freq: Every day | ORAL | Status: DC

## 2013-12-26 MED ORDER — ONDANSETRON HCL 4 MG/2ML IJ SOLN
INTRAMUSCULAR | Status: AC
  Administered 2013-12-26: 4 mg via INTRAVENOUS
  Filled 2013-12-26: qty 2

## 2013-12-26 MED ORDER — KCL IN DEXTROSE-NACL 20-5-0.45 MEQ/L-%-% IV SOLN
INTRAVENOUS | Status: DC
  Administered 2013-12-26: 18:00:00 via INTRAVENOUS
  Filled 2013-12-26 (×2): qty 1000

## 2013-12-26 MED ORDER — ASPIRIN 81 MG PO CHEW
81.0000 mg | CHEWABLE_TABLET | Freq: Every day | ORAL | Status: DC
  Administered 2013-12-26 – 2013-12-27 (×2): 81 mg via ORAL
  Filled 2013-12-26 (×2): qty 1

## 2013-12-26 MED ORDER — LACTATED RINGERS IV SOLN
INTRAVENOUS | Status: DC
  Administered 2013-12-26: 12:00:00 via INTRAVENOUS

## 2013-12-26 MED ORDER — BUPIVACAINE HCL (PF) 0.25 % IJ SOLN
INTRAMUSCULAR | Status: DC | PRN
  Administered 2013-12-26: 10 mL

## 2013-12-26 MED ORDER — ONDANSETRON HCL 4 MG PO TABS
4.0000 mg | ORAL_TABLET | Freq: Four times a day (QID) | ORAL | Status: DC | PRN
Start: 2013-12-26 — End: 2013-12-27

## 2013-12-26 SURGICAL SUPPLY — 59 items
BANDAGE ELASTIC 3 VELCRO ST LF (GAUZE/BANDAGES/DRESSINGS) ×3 IMPLANT
BANDAGE ELASTIC 4 VELCRO ST LF (GAUZE/BANDAGES/DRESSINGS) ×3 IMPLANT
BANDAGE GAUZE ELAST BULKY 4 IN (GAUZE/BANDAGES/DRESSINGS) ×3 IMPLANT
BIT DRILL 2.2 SS TIBIAL (BIT) ×2 IMPLANT
BLADE SURG ROTATE 9660 (MISCELLANEOUS) IMPLANT
BNDG CMPR 9X4 STRL LF SNTH (GAUZE/BANDAGES/DRESSINGS) ×1
BNDG ESMARK 4X9 LF (GAUZE/BANDAGES/DRESSINGS) ×3 IMPLANT
BNDG GAUZE ELAST 4 BULKY (GAUZE/BANDAGES/DRESSINGS) ×2 IMPLANT
CLOTH BEACON ORANGE TIMEOUT ST (SAFETY) ×1 IMPLANT
CORDS BIPOLAR (ELECTRODE) ×3 IMPLANT
COVER SURGICAL LIGHT HANDLE (MISCELLANEOUS) ×3 IMPLANT
CUFF TOURNIQUET SINGLE 18IN (TOURNIQUET CUFF) ×3 IMPLANT
CUFF TOURNIQUET SINGLE 24IN (TOURNIQUET CUFF) IMPLANT
DRAIN TLS ROUND 10FR (DRAIN) IMPLANT
DRAPE OEC MINIVIEW 54X84 (DRAPES) ×3 IMPLANT
DRAPE SURG 17X11 SM STRL (DRAPES) ×3 IMPLANT
DRSG ADAPTIC 3X8 NADH LF (GAUZE/BANDAGES/DRESSINGS) ×3 IMPLANT
ELECT REM PT RETURN 9FT ADLT (ELECTROSURGICAL) ×3
ELECTRODE REM PT RTRN 9FT ADLT (ELECTROSURGICAL) IMPLANT
GLOVE BIOGEL PI IND STRL 8.5 (GLOVE) ×1 IMPLANT
GLOVE BIOGEL PI INDICATOR 8.5 (GLOVE) ×2
GLOVE SURG ORTHO 8.0 STRL STRW (GLOVE) ×3 IMPLANT
GOWN PREVENTION PLUS XLARGE (GOWN DISPOSABLE) ×3 IMPLANT
GOWN STRL NON-REIN LRG LVL3 (GOWN DISPOSABLE) ×9 IMPLANT
K-WIRE 1.6 (WIRE) ×3
K-WIRE FX5X1.6XNS BN SS (WIRE) ×1
KIT BASIN OR (CUSTOM PROCEDURE TRAY) ×3 IMPLANT
KIT ROOM TURNOVER OR (KITS) ×3 IMPLANT
KWIRE FX5X1.6XNS BN SS (WIRE) IMPLANT
MANIFOLD NEPTUNE II (INSTRUMENTS) ×1 IMPLANT
NDL HYPO 25X1 1.5 SAFETY (NEEDLE) ×1 IMPLANT
NEEDLE HYPO 25X1 1.5 SAFETY (NEEDLE) ×3 IMPLANT
NS IRRIG 1000ML POUR BTL (IV SOLUTION) ×3 IMPLANT
PACK ORTHO EXTREMITY (CUSTOM PROCEDURE TRAY) ×3 IMPLANT
PAD ARMBOARD 7.5X6 YLW CONV (MISCELLANEOUS) ×6 IMPLANT
PAD CAST 4YDX4 CTTN HI CHSV (CAST SUPPLIES) ×1 IMPLANT
PADDING CAST ABS 4INX4YD NS (CAST SUPPLIES) ×2
PADDING CAST ABS COTTON 4X4 ST (CAST SUPPLIES) IMPLANT
PADDING CAST COTTON 4X4 STRL (CAST SUPPLIES) ×3
PEG LOCKING SMOOTH 2.2X18 (Peg) ×2 IMPLANT
PEG LOCKING SMOOTH 2.2X20 (Screw) ×8 IMPLANT
PEG LOCKING SMOOTH 2.2X22 (Screw) ×6 IMPLANT
PLATE STANDARD DVR LEFT (Plate) ×3 IMPLANT
PLATE STD DVR LT 24X51 (Plate) IMPLANT
SCREW LOCK 16X2.7X 3 LD TPR (Screw) IMPLANT
SCREW LOCKING 2.7X16 (Screw) ×15 IMPLANT
SOAP 2 % CHG 4 OZ (WOUND CARE) ×3 IMPLANT
SPLINT FIBERGLASS 4X30 (CAST SUPPLIES) ×2 IMPLANT
SPONGE GAUZE 4X4 12PLY (GAUZE/BANDAGES/DRESSINGS) ×3 IMPLANT
SUT PROLENE 4 0 PS 2 18 (SUTURE) ×5 IMPLANT
SUT VIC AB 2-0 FS1 27 (SUTURE) ×3 IMPLANT
SUT VICRYL 4-0 PS2 18IN ABS (SUTURE) ×3 IMPLANT
SYR CONTROL 10ML LL (SYRINGE) ×2 IMPLANT
SYSTEM CHEST DRAIN TLS 7FR (DRAIN) IMPLANT
TOWEL OR 17X24 6PK STRL BLUE (TOWEL DISPOSABLE) ×3 IMPLANT
TOWEL OR 17X26 10 PK STRL BLUE (TOWEL DISPOSABLE) ×3 IMPLANT
TUBE CONNECTING 12'X1/4 (SUCTIONS) ×1
TUBE CONNECTING 12X1/4 (SUCTIONS) ×2 IMPLANT
WATER STERILE IRR 1000ML POUR (IV SOLUTION) ×1 IMPLANT

## 2013-12-26 NOTE — Discharge Instructions (Signed)
KEEP BANDAGE CLEAN AND DRY CALL OFFICE FOR F/U APPT [phone] IN 14 DAYS DR Caralyn Guile CELL [phone] KEEP HAND ELEVATED ABOVE HEART OK TO APPLY ICE TO OPERATIVE AREA CONTACT OFFICE IF ANY WORSENING PAIN OR CONCERNS.

## 2013-12-26 NOTE — Brief Op Note (Signed)
12/26/2013  1:30 PM  PATIENT:  [name]  68 y.o. male  PRE-OPERATIVE DIAGNOSIS:  Left Wrist Fracture  POST-OPERATIVE DIAGNOSIS:  left wrist fracture  PROCEDURE:  Procedure(s): OPEN REDUCTION INTERNAL FIXATION (ORIF) WRIST FRACTURE (Left)  SURGEON:  Surgeon(s) and Role:    * Linna Hoff, MD - Primary  PHYSICIAN ASSISTANT:   ASSISTANTS: none   ANESTHESIA:   general  EBL:     BLOOD ADMINISTERED:none  DRAINS: none   LOCAL MEDICATIONS USED:  MARCAINE     SPECIMEN:  No Specimen  DISPOSITION OF SPECIMEN:  N/A  COUNTS:  YES  TOURNIQUET:  LESS THAN ONE HOUR  DICTATION: .[id number]  PLAN OF CARE: Admit for overnight observation  PATIENT DISPOSITION:  PACU - hemodynamically stable.   Delay start of Pharmacological VTE agent (>24hrs) due to surgical blood loss or risk of bleeding: not applicable

## 2013-12-26 NOTE — Progress Notes (Signed)
R/B/A DISCUSSED WITH PT IN ED AND HOLDING AREA  PT VOICED UNDERSTANDING OF PLAN CONSENT SIGNED DAY OF SURGERY PT SEEN AND EXAMINED PRIOR TO OPERATIVE PROCEDURE/DAY OF SURGERY SITE MARKED. QUESTIONS ANSWERED WILL REMAIN AS OBSERVATION FOLLOWING SURGERY

## 2013-12-26 NOTE — Anesthesia Procedure Notes (Signed)
Procedure Name: LMA Insertion Date/Time: 12/26/2013 1:30 PM Performed by: Marinda Elk A Pre-anesthesia Checklist: Patient identified, Timeout performed, Emergency Drugs available, Suction available and Patient being monitored Patient Re-evaluated:Patient Re-evaluated prior to inductionOxygen Delivery Method: Circle system utilized Preoxygenation: Pre-oxygenation with 100% oxygen Intubation Type: IV induction LMA: LMA inserted LMA Size: 5.0 Number of attempts: 1 Placement Confirmation: positive ETCO2 and breath sounds checked- equal and bilateral Tube secured with: Tape Dental Injury: Teeth and Oropharynx as per pre-operative assessment

## 2013-12-26 NOTE — Progress Notes (Signed)
Used sign language interpreter though language resources for preop.

## 2013-12-26 NOTE — Op Note (Signed)
NAMEJOVANNI, RASH NO.:  [id number]  MEDICAL RECORD NO.:  [id number]  LOCATION:  [id number]                        FACILITY:  [facility]  PHYSICIAN:  Melrose Nakayama, MD  DATE OF BIRTH:  14-May-1946  DATE OF PROCEDURE:  12/26/2013 DATE OF DISCHARGE:                              OPERATIVE REPORT   PREOPERATIVE DIAGNOSES: 1. Left wrist intra-articular distal radius fracture three or more     fragments. 2. Left distal ulnar neck fracture.  POSTOPERATIVE DIAGNOSES: 1. Left wrist intra-articular distal radius fracture three or more     fragments. 2. Left distal ulnar neck fracture.  ATTENDING PHYSICIAN:  Melrose Nakayama, MD; who was scrubbed and present for the entire procedure.  ASSISTANT SURGEON:  None.  SURGICAL PROCEDURE: 1. Open treatment of left wrist intra-articular distal radius fracture     3 or more fragments. 2. Closed treatment on left wrist brachioradialis tendon release and     lengthening.  Tendon tenotomy. 3. Left wrist closed treatment of distal ulnar fracture without     internal fixation. 4. Radiographs 3 views left wrist.  SURGICAL IMPLANTS:  Biomet cross lock standard left plate with combination of locking and nonlocking screws and pegs.  SURGICAL INDICATIONS:  Mr. Kaufman is a 68 year old gentleman who sustained a closed left wrist intra-articular distal radius fracture. The patient underwent reduction in the ER based on this still persistent displaced and as recommended he undergo the above procedure.  Risks, benefits, and alternatives were discussed in detail with the patient and signed informed consent was obtained.  Risks include, but not limited to bleeding, infection, damage to nearby nerves, arteries, or tendons, loss of motion of wrist and digits, incomplete relief of symptoms, nonunion, malunion, and hardware failure, and need for further surgical intervention.  DESCRIPTION OF PROCEDURE:  The patient was properly identified in  the preoperative holding area and marked with a permanent marker made on the left wrist to indicate the correct operative site.  The patient was then brought back to the operating room, placed supine on anesthesia room table, where general anesthesia was administered.  The patient tolerated this well.  A well-padded tourniquet was then placed on the left brachium sealed with 1000 drape.  Left upper extremity was then prepped and draped in normal sterile fashion.  A time-out was called, correct side was identified, and procedure then begun.  Attention then turned to the left wrist where a longitudinal incision made directly over the FCR. The limb was then elevated and tourniquet insufflated.  Dissection was carried down through the skin and subcutaneous tissue.  Preoperative antibiotics were given prior to skin incision.  The FCR sheath was then opened up and then going through the floor of the FCR sheath, the FPL was then carefully retracted.  L-shaped pronator quadratus flap was then carried out and then the fracture site was then exposed.  In order to regain reduction of the radial column, brachioradialis was then carefully released and tenotomy off the radial styloid.  The patient had the intra-articular fracture 3 or more articular fragments and an open reduction was then performed.  After the open reduction was then manually performed and carefully  reduced, the volar plate was then applied.  The oblong screw hole was then placed proximally and the plate height was adjusted for the appropriate position.  It was held in place with a K-wire.  Following this, distal fixation was then carried out from the proximal row with the distal locking pegs.  Its position was then confirmed using the mini C-arm.  Following this, the distal locking pegs were then placed.  Following this, proximal fixation was then carried out with a combination of locking and nonlocking screws within the cortical  shaft of the radius.  The wound was then thoroughly irrigated.  Final radiographs were then obtained, AP and lateral, and oblique and similar pronator views were then obtained.  This showed good placement of the articular pegs underneath the articular surface.  The wound was then irrigated.  The pronator quadratus was then closed with 2- 0 Vicryl.  The subcutaneous tissue was closed with 4-0 Vicryl. Tourniquet deflated.  Hemostasis obtained with bipolar electrocautery and the skin was then closed using simple Prolene sutures.  Adaptic dressing and sterile compressive bandage was then applied.  The patient tolerated the procedure well, was placed in a well-padded sugar-tong splint, extubated, and taken to recovery room in good condition.  INTRAOPERATIVE RADIOGRAPHS:  Three views of the wrist did show the internal fixation in place.  The patient does have the closed fracture of the distal ulnar neck without malalignment.  POSTOPERATIVE PLAN:  The patient was admitted overnight for IV antibiotics and pain control, discharged in the morning, seen back in the office approximately 2 weeks for wound check, suture removal, application of short-arm cast and then therapy beginning at 4 week, mark x-rays at each visit.     Melrose Nakayama, MD     FWO/MEDQ  D:  12/26/2013  T:  12/26/2013  Job:  [id number]

## 2013-12-26 NOTE — Anesthesia Preprocedure Evaluation (Addendum)
Anesthesia Evaluation  Patient identified by MRN, date of birth, ID band Patient awake    Reviewed: Allergy & Precautions, H&P , NPO status , Patient's Chart, lab work & pertinent test results  Airway Mallampati: I TM Distance: >3 FB Neck ROM: Full    Dental  (+) Teeth Intact, Dental Advisory Given   Pulmonary          Cardiovascular     Neuro/Psych    GI/Hepatic GERD-  Medicated and Controlled,  Endo/Other    Renal/GU      Musculoskeletal   Abdominal   Peds  Hematology   Anesthesia Other Findings Deaf  Reproductive/Obstetrics                          Anesthesia Physical Anesthesia Plan  ASA: I  Anesthesia Plan: General   Post-op Pain Management:    Induction: Intravenous  Airway Management Planned: LMA and Oral ETT  Additional Equipment:   Intra-op Plan:   Post-operative Plan:   Informed Consent: I have reviewed the patients History and Physical, chart, labs and discussed the procedure including the risks, benefits and alternatives for the proposed anesthesia with the patient or authorized representative who has indicated his/her understanding and acceptance.     Plan Discussed with:   Anesthesia Plan Comments:         Anesthesia Quick Evaluation

## 2013-12-26 NOTE — Transfer of Care (Signed)
Immediate Anesthesia Transfer of Care Note  Patient: [name]  Procedure(s) Performed: Procedure(s): OPEN REDUCTION INTERNAL FIXATION (ORIF) WRIST FRACTURE (Left)  Patient Location: PACU  Anesthesia Type:General  Level of Consciousness: sedated  Airway & Oxygen Therapy: Patient Spontanous Breathing and Patient connected to nasal cannula oxygen  Post-op Assessment: Report given to PACU RN and Post -op Vital signs reviewed and stable  Post vital signs: Reviewed and stable  Complications: No apparent anesthesia complications

## 2013-12-26 NOTE — Anesthesia Postprocedure Evaluation (Signed)
  Anesthesia Post-op Note  Patient: [name]  Procedure(s) Performed: Procedure(s): OPEN REDUCTION INTERNAL FIXATION (ORIF) WRIST FRACTURE (Left)  Patient Location: PACU  Anesthesia Type:General  Level of Consciousness: awake, oriented, sedated and patient cooperative  Airway and Oxygen Therapy: Patient Spontanous Breathing  Post-op Pain: mild  Post-op Assessment: Post-op Vital signs reviewed, Patient's Cardiovascular Status Stable, Respiratory Function Stable, Patent Airway, No signs of Nausea or vomiting and Pain level controlled  Post-op Vital Signs: stable  Complications: No apparent anesthesia complications

## 2013-12-26 NOTE — Preoperative (Signed)
Beta Blockers   Reason not to administer Beta Blockers:Not Applicable 

## 2013-12-26 NOTE — Addendum Note (Signed)
Addendum created 12/26/13 1732 by Sheppard Plumber, CRNA   Modules edited: Anesthesia Blocks and Procedures, Clinical Notes   Clinical Notes:  File: [id number]

## 2013-12-27 NOTE — Discharge Summary (Signed)
Physician Discharge Summary  Patient ID: [name] MRN: [id number] DOB/AGE: 1946-01-04 68 y.o.  Admit date: 12/26/2013 Discharge date: 12/27/2013  Admission Diagnoses: Left Wrist Fracture Past Medical History  Diagnosis Date  . Medical history non-contributory   . Cancer     prostate Ca 2006 and 2008  . GERD (gastroesophageal reflux disease)     Discharge Diagnoses:  Active Problems:   Closed fracture of left distal radius   Surgeries: Procedure(s): OPEN REDUCTION INTERNAL FIXATION (ORIF) WRIST FRACTURE on 12/26/2013    Consultants:    Discharged Condition: Improved  Hospital Course: [name] is an 68 y.o. male who was admitted 12/26/2013 with a chief complaint of No chief complaint on file. , and found to have a diagnosis of Left Wrist Fracture.  They were brought to the operating room on 12/26/2013 and underwent Procedure(s): OPEN REDUCTION INTERNAL FIXATION (ORIF) WRIST FRACTURE.    They were given perioperative antibiotics: Anti-infectives   Start     Dose/Rate Route Frequency Ordered Stop   12/27/13 0100  ceFAZolin (ANCEF) IVPB 1 g/50 mL premix     1 g 100 mL/hr over 30 Minutes Intravenous Every 8 hours 12/26/13 1637     12/26/13 1700  ceFAZolin (ANCEF) IVPB 1 g/50 mL premix     1 g 100 mL/hr over 30 Minutes Intravenous NOW 12/26/13 1637 12/26/13 1820   12/26/13 1130  ceFAZolin (ANCEF) IVPB 2 g/50 mL premix     2 g 100 mL/hr over 30 Minutes Intravenous On call to O.R. 12/26/13 1117 12/26/13 1332    .  They were given sequential compression devices, early ambulation, and Other (comment) for DVT prophylaxis.  Recent vital signs: Patient Vitals for the past 24 hrs:  BP Temp Temp src Pulse Resp SpO2 Height Weight  12/27/13 1330 160/64 mmHg 98.4 F (36.9 C) - 92 18 99 % - -  12/27/13 0700 - - - - - - 5\' 5"  (1.651 m) 77.111 kg (170 lb)  12/27/13 0606 155/63 mmHg 99.4 F (37.4 C) Oral 77 20 99 % - -  12/26/13 2145 158/65 mmHg 98.6 F (37 C) Oral 76 18 100 %  - -  12/26/13 1630 170/73 mmHg 97.4 F (36.3 C) Oral 79 16 100 % - -  12/26/13 1600 160/68 mmHg - - 79 15 96 % - -  12/26/13 1545 164/71 mmHg 98.5 F (36.9 C) - 76 16 97 % - -  12/26/13 1530 168/73 mmHg - - 89 14 98 % - -  12/26/13 1515 168/74 mmHg - - 88 15 97 % - -  12/26/13 1500 170/72 mmHg - - 96 14 97 % - -  12/26/13 1457 - 98.8 F (37.1 C) - - - - - -  .  Recent laboratory studies: Dg Wrist Complete Left  01/20/14   CLINICAL DATA:  [place] and injured left wrist.  Post reduction.  EXAM: LEFT WRIST - COMPLETE 3+ VIEW  COMPARISON:  None.  FINDINGS: Examination was performed in cast material. Comminuted impacted intra-articular distal radius fracture with only slight dorsal angulation on the postreduction images. Ulnar styloid fracture also noted.  IMPRESSION: 1. Comminuted impacted intra-articular distal radius fracture with only slight dorsal angulation on the post reduction images. 2. Ulnar styloid fracture.   Electronically Signed   By: Evangeline Dakin M.D.   On: 2014/01/20 19:26    Discharge Medications:     Medication List         aspirin 81 MG tablet  Take 81 mg  by mouth daily.     atorvastatin 40 MG tablet  Commonly known as:  LIPITOR  Take 20 mg by mouth daily at 6 PM.     cholecalciferol 1000 UNITS tablet  Commonly known as:  VITAMIN D  Take 1,000 Units by mouth daily.     Chromium 1000 MCG Tabs  Take 1,000 mcg by mouth daily.     docusate sodium 100 MG capsule  Commonly known as:  COLACE  Take 1 capsule (100 mg total) by mouth 2 (two) times daily.     Fish Oil 1200 MG Caps  Take 1,200 mg by mouth daily.     losartan 100 MG tablet  Commonly known as:  COZAAR  Take 100 mg by mouth daily.     methocarbamol 500 MG tablet  Commonly known as:  ROBAXIN  Take 1 tablet (500 mg total) by mouth 4 (four) times daily.     omeprazole 20 MG capsule  Commonly known as:  PRILOSEC  Take 20 mg by mouth daily.     oxyCODONE-acetaminophen 10-325 MG per tablet   Commonly known as:  PERCOCET  Take 1 tablet by mouth every 4 (four) hours as needed for pain.     oxyCODONE-acetaminophen 5-325 MG per tablet  Commonly known as:  PERCOCET/ROXICET  Take 1-2 tablets by mouth every 4 (four) hours as needed for moderate pain or severe pain.     vitamin C 500 MG tablet  Commonly known as:  ASCORBIC ACID  Take 1 tablet (500 mg total) by mouth daily.        Diagnostic Studies: Dg Wrist Complete Left  14-Jan-2014   CLINICAL DATA:  [place] and injured left wrist.  Post reduction.  EXAM: LEFT WRIST - COMPLETE 3+ VIEW  COMPARISON:  None.  FINDINGS: Examination was performed in cast material. Comminuted impacted intra-articular distal radius fracture with only slight dorsal angulation on the postreduction images. Ulnar styloid fracture also noted.  IMPRESSION: 1. Comminuted impacted intra-articular distal radius fracture with only slight dorsal angulation on the post reduction images. 2. Ulnar styloid fracture.   Electronically Signed   By: Evangeline Dakin M.D.   On: 14-Jan-2014 19:26    They benefited maximally from their hospital stay and there were no complications.     Disposition: 01-Home or Self Care      Future Appointments Provider Department Dept Phone   01/27/2014 10:00 AM [facility] [phone]   NPO after midnight   01/27/2014 1:00 PM [facility] [phone]     Follow-up Information   Follow up with Linna Hoff, MD. Schedule an appointment as soon as possible for a visit in 14 days.   Specialty:  Orthopedic Surgery   Contact information:   [address] [place] [address] [phone]      PT SEEN/EXAMINED READY TO GO HOME PT [facility] PLAN WITH INTERPRETER PRESENT  Signed: Linna Hoff 12/27/2013, 1:32 PM

## 2013-12-27 NOTE — Progress Notes (Signed)
PT Cancellation Note  Patient Details Name: [name] MRN: [id number] DOB: [birth year]   Cancelled Treatment:    Reason Eval/Treat Not Completed: PT screened, no needs identified, will sign off  Discussed pt with OT and RN, who indicated no needs for PT  Thank you,   Roney Marion, [place]    [place] 12/27/2013, 9:19 AM

## 2013-12-27 NOTE — Progress Notes (Signed)
Discharge instructions and prescriptions given to and reviewed with patient, and explained via interpreter. Patient demonstrates understanding. Denies questions or concerns. IV removed. Vital signs stable. Patient discharged with personal belongings and discharge packet via wheelchair accompanied by wife and NT.

## 2013-12-27 NOTE — Evaluation (Signed)
Occupational Therapy Evaluation and Discharge Patient Details Name: [name] MRN: [id number] DOB: [birth year] Today's Date: 12/27/2013 Time: 3295-1884 OT Time Calculation (min): 27 min  OT Assessment / Plan / Recommendation History of present illness Open treatment of left wrist intra-articular distal radius fracture or more fragments. Closed treatment on left wrist brachioradialis tendon release and lengthening. Tendon tenotomy. Left wrist closed treatment of distal ulnar fracture without internal fixation.   Clinical Impression   This 68 yo male admitted and underwent above presents to acute OT with all education completed with he and his wife. Pt ambulating around the unit without issues, made PT aware.Acute OT will sign off.    OT Assessment  Patient does not need any further OT services    Follow Up Recommendations  No OT follow up       Equipment Recommendations  None recommended by OT          Precautions / Restrictions Precautions Required Braces or Orthoses: Sling Restrictions Weight Bearing Restrictions: Yes LUE Weight Bearing: Non weight bearing   Pertinent Vitals/Pain 0    ADL  Toilet Transfer: Independent Toilet Transfer Method: Sit to stand Toilet Transfer Equipment:  (Bed>walking out in hallway and back to room) Toileting - Water quality scientist and Hygiene: Minimal assistance Where Assessed - Best boy and Hygiene: Sit to stand from 3-in-1 or toilet Transfers/Ambulation Related to ADLs: Independent ADL Comments: Wife will A him with B/D until he can do them more independently by himself. Pt and wife aware of how to keep arm dry whle showering, how to don/doff UB clothing (hand out also provided) Issued pt a squeeze ball as well       Acute Rehab OT Goals Patient Stated Goal: Home today  Visit Information  Last OT Received On: 12/27/13 Assistance Needed: +1 History of Present Illness: Open treatment of left wrist  intra-articular distal radius fracture or more fragments. Closed treatment on left wrist brachioradialis tendon release and lengthening. Tendon tenotomy. Left wrist closed treatment of distal ulnar fracture without internal fixation.       Prior Marion expects to be discharged to:: Private residence Living Arrangements: Spouse/significant other Available Help at Discharge: Family;Available 24 hours/day Type of Home: House Home Access: Stairs to enter [employer] of Steps: 1 Home Layout: One level Home Equipment: None Prior Function Level of Independence: Independent Communication Communication: Deaf (wife deaf also, used finger spelling and writing to communicate) Dominant Hand: Right         Vision/Perception Vision - History Patient Visual Report: No change from baseline   Cognition  Cognition Arousal/Alertness: Awake/alert Behavior During Therapy: WFL for tasks assessed/performed Overall Cognitive Status: Within Functional Limits for tasks assessed    Extremity/Trunk Assessment Upper Extremity Assessment Upper Extremity Assessment: LUE deficits/detail LUE Deficits / Details: Unable to move elbow, wrist, forearm due to casting post surgery; shoulder WNL, hand limited due to edema LUE Coordination: decreased fine motor;decreased gross motor     Mobility Bed Mobility Overal bed mobility: Independent Transfers Overall transfer level: Independent     Exercise Other Exercises Other Exercises: Instructed pt and wife in shoulder flexion, shoulder horizontal ab/adduction; hand full composite flexion and extension as well as thumb flexion/extension. To do these throughout the day. Also in structed in arm elevation for edema control.      End of Session OT - End of Session Activity Tolerance: Patient tolerated treatment well Patient left: in chair;with call bell/phone within reach;with family/visitor present  Nurse  Communication:  (Pt good to go from OT standpoint)  GO Functional Assessment Tool Used: Clinical observation Functional Limitation: Self care Self Care Current Status (T3428): At least 20 percent but less than 40 percent impaired, limited or restricted Self Care Goal Status (J6811): At least 20 percent but less than 40 percent impaired, limited or restricted Self Care Discharge Status [id number]): At least 20 percent but less than 40 percent impaired, limited or restricted   Almon Register [phone] 12/27/2013, 11:35 AM

## 2013-12-27 NOTE — ED Provider Notes (Signed)
Medical screening examination/treatment/procedure(s) were performed by non-physician practitioner and as supervising physician I was immediately available for consultation/collaboration.  EKG Interpretation   None         Osvaldo Shipper, MD 12/27/13 [phone]

## 2013-12-28 ENCOUNTER — Encounter (HOSPITAL_COMMUNITY): Payer: Self-pay | Admitting: Orthopedic Surgery

## 2014-01-26 ENCOUNTER — Encounter (HOSPITAL_COMMUNITY): Payer: Medicare PPO

## 2014-01-26 ENCOUNTER — Encounter (HOSPITAL_COMMUNITY): Payer: Self-pay

## 2014-01-27 ENCOUNTER — Encounter (HOSPITAL_COMMUNITY)
Admission: RE | Admit: 2014-01-27 | Discharge: 2014-01-27 | Disposition: A | Discharge status: Home / Self Care | Payer: Medicare PPO | Source: Ambulatory Visit | Attending: Urology | Admitting: Urology

## 2014-01-27 DIAGNOSIS — C61 Malignant neoplasm of prostate: Secondary | ICD-10-CM | POA: Insufficient documentation

## 2014-01-27 MED ORDER — TECHNETIUM TC 99M MEDRONATE IV KIT
25.0000 | PACK | Freq: Once | INTRAVENOUS | Status: AC | PRN
  Administered 2014-01-27: 25 via INTRAVENOUS

## 2014-02-09 ENCOUNTER — Telehealth: Payer: Self-pay | Admitting: Gastroenterology

## 2014-02-09 NOTE — Telephone Encounter (Signed)
Patient was heme negative.  Hgb 10.9 on 2/21. Hgb is now 11.4.  Patient is scheduled for 03/08/14 with Dr. Fuller Plan. They will call back if Dr. Minna Antis wants seen earlier

## 2014-03-08 ENCOUNTER — Ambulatory Visit (INDEPENDENT_AMBULATORY_CARE_PROVIDER_SITE_OTHER): Payer: Medicare PPO | Admitting: Gastroenterology

## 2014-03-08 ENCOUNTER — Encounter: Payer: Self-pay | Admitting: Gastroenterology

## 2014-03-08 VITALS — BP 154/68 | HR 80 | Ht 64.17 in | Wt 162.1 lb

## 2014-03-08 DIAGNOSIS — K2270 Barrett's esophagus without dysplasia: Secondary | ICD-10-CM

## 2014-03-08 DIAGNOSIS — D509 Iron deficiency anemia, unspecified: Secondary | ICD-10-CM

## 2014-03-08 MED ORDER — PEG-KCL-NACL-NASULF-NA ASC-C 100 G PO SOLR: 1.0000 | Freq: Once | ORAL | Status: DC

## 2014-03-08 NOTE — Patient Instructions (Signed)
You have been scheduled for a colonoscopy with propofol. Please follow written instructions given to you at your visit today.  Please pick up your prep kit at the pharmacy within the next 1-3 days. If you use inhalers (even only as needed), please bring them with you on the day of your procedure.  Thank you for choosing me and [facility].  Pricilla Riffle. Dagoberto Ligas., MD., Marval Regal  cc: Tommy Medal, MD

## 2014-03-08 NOTE — Progress Notes (Signed)
    History of Present Illness: This is a 68 year old male accompanied by his wife. The patient and his wife are deaf and a sign language interpreter is present as well. The patient was really recently diagnosed with an iron deficiency anemia. He has a history of Barrett's esophagus and is overdue for surveillance. Last colonoscopy was 2007. He has no gastrointestinal complaints. Denies weight loss, abdominal pain, constipation, diarrhea, change in stool caliber, melena, hematochezia, nausea, vomiting, dysphagia, reflux symptoms, chest pain.  Review of Systems: Pertinent positive and negative review of systems were noted in the above HPI section. All other review of systems were otherwise negative.  Current Medications, Allergies, Past Medical History, Past Surgical History, Family History and Social History were reviewed in [employer] record.  Physical Exam: General: Well developed , well nourished, no acute distress Head: Normocephalic and atraumatic Eyes:  sclerae anicteric, EOMI Ears: Normal auditory acuity Mouth: No deformity or lesions Neck: Supple, no masses or thyromegaly Lungs: Clear throughout to auscultation Heart: Regular rate and rhythm; no murmurs, rubs or bruits Abdomen: Soft, non tender and non distended. No masses, hepatosplenomegaly or hernias noted. Normal Bowel sounds Rectal: deferred to colonoscopy Musculoskeletal: Symmetrical with no gross deformities  Skin: No lesions on visible extremities Pulses:  Normal pulses noted Extremities: No clubbing, cyanosis, edema or deformities noted Neurological: Alert oriented x 4, grossly nonfocal Cervical Nodes:  No significant cervical adenopathy Inguinal Nodes: No significant inguinal adenopathy Psychological:  Alert and cooperative. Normal mood and affect  Assessment and Recommendations:  1. Fe deficiency anemia with heme negative stool. History of Barrett's esophagus. Continue omeprazole 20 mg daily  and standard antireflux measures. Obtain celiac antibody testing. Schedule EGD and colonoscopy. .The risks, benefits, and alternatives to colonoscopy with possible biopsy and possible polypectomy were discussed with the patient and they consent to proceed. The risks, benefits, and alternatives to endoscopy with possible biopsy and possible dilation were discussed with the patient and they consent to proceed.

## 2014-03-24 ENCOUNTER — Encounter: Payer: Self-pay | Admitting: Cardiovascular Disease

## 2014-03-24 ENCOUNTER — Ambulatory Visit (INDEPENDENT_AMBULATORY_CARE_PROVIDER_SITE_OTHER): Payer: Self-pay | Admitting: *Deleted

## 2014-03-24 DIAGNOSIS — Z006 Encounter for examination for normal comparison and control in clinical research program: Secondary | ICD-10-CM

## 2014-03-24 NOTE — Progress Notes (Signed)
[facility] EKG done and reviewed by Dr Johnsie Cancel, copy given to patient.

## 2014-04-02 ENCOUNTER — Encounter: Payer: Self-pay | Admitting: Gastroenterology

## 2014-04-12 ENCOUNTER — Ambulatory Visit (AMBULATORY_SURGERY_CENTER): Payer: Medicare PPO | Admitting: Gastroenterology

## 2014-04-12 ENCOUNTER — Encounter: Payer: Self-pay | Admitting: Gastroenterology

## 2014-04-12 VITALS — BP 125/63 | HR 63 | Temp 97.5°F | Resp 16 | Ht 64.0 in | Wt 162.0 lb

## 2014-04-12 DIAGNOSIS — K2100 Gastro-esophageal reflux disease with esophagitis, without bleeding: Secondary | ICD-10-CM

## 2014-04-12 DIAGNOSIS — D509 Iron deficiency anemia, unspecified: Secondary | ICD-10-CM

## 2014-04-12 DIAGNOSIS — K2270 Barrett's esophagus without dysplasia: Secondary | ICD-10-CM

## 2014-04-12 DIAGNOSIS — Z1211 Encounter for screening for malignant neoplasm of colon: Secondary | ICD-10-CM

## 2014-04-12 MED ORDER — SODIUM CHLORIDE 0.9 % IV SOLN: 500.0000 mL | INTRAVENOUS | Status: DC

## 2014-04-12 NOTE — Progress Notes (Signed)
Interpreter at bedside . Wife stating patient is on an iron supplement that he uses twice daily.

## 2014-04-12 NOTE — Progress Notes (Signed)
Report to PACU, RN, vss, BBS= Clear.  

## 2014-04-12 NOTE — Op Note (Signed)
[facility]  [address] [place], [id number]   ENDOSCOPY PROCEDURE REPORT  PATIENT: Bauer, Ausborn  MR#: [id number] BIRTHDATE: 07-Jun-1946 , 76  yrs. old GENDER: Male ENDOSCOPIST: Ladene Artist, MD, [facility] REFERRED BY:  Tommy Medal, M.D. PROCEDURE DATE:  04/12/2014 PROCEDURE:  EGD w/ biopsy ASA CLASS:     Class II INDICATIONS:  history of Barrett's esophagus.   Iron deficiency anemia. MEDICATIONS: MAC sedation, administered by CRNA, There was residual sedation effect present from prior procedure, and propofol (Diprivan) 50mg  IV TOPICAL ANESTHETIC: none DESCRIPTION OF PROCEDURE: After the risks benefits and alternatives of the procedure were thoroughly explained, informed consent was obtained.  The LB [id number] endoscope was introduced through the mouth and advanced to the second portion of the duodenum. Without limitations.  The instrument was slowly withdrawn as the mucosa was fully examined.  ESOPHAGUS: There was evidence of Barrett's esophagus in the lower third of the esophagus. It measured 1 cm.  Multiple random biopsies were performed.   The mucosa of the esophagus appeared normal. The mid and proximal exophagus was slightly tortuous and mildly dilated. STOMACH: The mucosa and folds of the stomach appeared normal. DUODENUM: The duodenal mucosa showed no abnormalities in the bulb and second portion of the duodenum.  Cold forceps biopsies were taken in the bulb and second portion.  Retroflexed views revealed a 6 cm hiatal hernia without Cameron erosions.  The scope was then withdrawn from the patient and the procedure completed.  COMPLICATIONS: There were no complications.  ENDOSCOPIC IMPRESSION: 1.   Barrett's esophagus; multiple biopsies 2.   6 cm hiatal hernia  RECOMMENDATIONS: 1.  Await pathology results 2.  Anti-reflux regimen long term 3.  Continue PPI daily 4.  Endoscopy in 3-5 years pending pathology review 5.  FeSO4 325 mg po bid  with meals and follow up with Dr. Minna Antis in 6 weeks  eSigned:  Ladene Artist, MD, [facility] 04/12/2014 3:05 PM

## 2014-04-12 NOTE — Patient Instructions (Signed)
YOU HAD AN ENDOSCOPIC PROCEDURE TODAY AT THE [facility]: Refer to the procedure report that was given to you for any specific questions about what was found during the examination.  If the procedure report does not answer your questions, please call your gastroenterologist to clarify.  If you requested that your care partner not be given the details of your procedure findings, then the procedure report has been included in a sealed envelope for you to review at your convenience later.  YOU SHOULD EXPECT: Some feelings of bloating in the abdomen. Passage of more gas than usual.  Walking can help get rid of the air that was put into your GI tract during the procedure and reduce the bloating. If you had a lower endoscopy (such as a colonoscopy or flexible sigmoidoscopy) you may notice spotting of blood in your stool or on the toilet paper. If you underwent a bowel prep for your procedure, then you may not have a normal bowel movement for a few days.  DIET: Your first meal following the procedure should be a light meal and then it is ok to progress to your normal diet.  A half-sandwich or bowl of soup is an example of a good first meal.  Heavy or fried foods are harder to digest and may make you feel nauseous or bloated.  Likewise meals heavy in dairy and vegetables can cause extra gas to form and this can also increase the bloating.  Drink plenty of fluids but you should avoid alcoholic beverages for 24 hours.  ACTIVITY: Your care partner should take you home directly after the procedure.  You should plan to take it easy, moving slowly for the rest of the day.  You can resume normal activity the day after the procedure however you should NOT DRIVE or use heavy machinery for 24 hours (because of the sedation medicines used during the test).    SYMPTOMS TO REPORT IMMEDIATELY: A gastroenterologist can be reached at any hour.  During normal business hours, 8:30 AM to 5:00 PM Monday through Friday,  call [phone].  After hours and on weekends, please call the GI answering service at [phone] who will take a message and have the physician on call contact you.   Following lower endoscopy (colonoscopy or flexible sigmoidoscopy):  Excessive amounts of blood in the stool  Significant tenderness or worsening of abdominal pains  Swelling of the abdomen that is new, acute  Fever of 100F or higher  Following upper endoscopy (EGD)  Vomiting of blood or coffee ground material  New chest pain or pain under the shoulder blades  Painful or persistently difficult swallowing  New shortness of breath  Fever of 100F or higher  Black, tarry-looking stools  FOLLOW UP: If any biopsies were taken you will be contacted by phone or by letter within the next 1-3 weeks.  Call your gastroenterologist if you have not heard about the biopsies in 3 weeks.  Our staff will call the home number listed on your records the next business day following your procedure to check on you and address any questions or concerns that you may have at that time regarding the information given to you following your procedure. This is a courtesy call and so if there is no answer at the home number and we have not heard from you through the emergency physician on call, we will assume that you have returned to your regular daily activities without incident.  SIGNATURES/CONFIDENTIALITY: You and/or your care   partner have signed paperwork which will be entered into your electronic medical record.  These signatures attest to the fact that that the information above on your After Visit Summary has been reviewed and is understood.  Full responsibility of the confidentiality of this discharge information lies with you and/or your care-partner.  

## 2014-04-12 NOTE — Progress Notes (Signed)
Called to room to assist during endoscopic procedure.  Patient ID and intended procedure confirmed with present staff. Received instructions for my participation in the procedure from the performing physician.  

## 2014-04-12 NOTE — Op Note (Signed)
[facility]  [address]. [place], [id number]   COLONOSCOPY PROCEDURE REPORT  PATIENT: [name], [name]  MR#: [id number] BIRTHDATE: Sep 09, 1946 , 40  yrs. old GENDER: Male ENDOSCOPIST: Ladene Artist, MD, [facility] REFERRED SH:UOHFGBM Minna Antis, M.D. PROCEDURE DATE:  04/12/2014 PROCEDURE:   Colonoscopy, diagnostic First Screening Colonoscopy - Avg.  risk and is 50 yrs.  old or older - No.  Prior Negative Screening - Now for repeat screening. N/A  History of Adenoma - Now for follow-up colonoscopy & has been > or = to 3 yrs.  N/A  Polyps Removed Today? No.  Recommend repeat exam, <10 yrs? No. ASA CLASS:   Class II INDICATIONS:Iron Deficiency Anemia. MEDICATIONS: MAC sedation, administered by CRNA and propofol (Diprivan) 250mg  IV DESCRIPTION OF PROCEDURE:   After the risks benefits and alternatives of the procedure were thoroughly explained, informed consent was obtained.  A digital rectal exam revealed no abnormalities of the rectum.   The LB [id number]  endoscope was introduced through the anus and advanced to the cecum, which was identified by both the appendix and ileocecal valve. No adverse events experienced.   The quality of the prep was good, using MoviPrep  The instrument was then slowly withdrawn as the colon was fully examined.  COLON FINDINGS: Mild diverticulosis was noted in the transverse colon, descending colon, and sigmoid colon.   The colon was otherwise normal.  There was no diverticulosis, inflammation, polyps or cancers unless previously stated.  Retroflexed views revealed moderate internal hemorrhoids. The time to cecum=3 minutes 13 seconds.  Withdrawal time=10 minutes 46 seconds.  The scope was withdrawn and the procedure completed. COMPLICATIONS: There were no complications.  ENDOSCOPIC IMPRESSION: 1.   Mild diverticulosis in the transverse colon, descending colon, and sigmoid colon 2.   Moderate internal hemorrhoids  RECOMMENDATIONS: 1.   High fiber diet with liberal fluid intake. 2.  You should continue to follow colorectal cancer screening guidelines for "routine risk" patients with a repeat colonoscopy in 10 years.  There is no need for routine, screening FOBT (stool) testing for at least 5 years.  eSigned:  Ladene Artist, MD, [facility] 04/12/2014 2:51 PM

## 2014-04-13 ENCOUNTER — Telehealth: Payer: Self-pay

## 2014-04-13 NOTE — Telephone Encounter (Signed)
No answer, left vm on daughter's phone

## 2014-04-16 ENCOUNTER — Encounter: Payer: Self-pay | Admitting: Gastroenterology

## 2015-02-24 DIAGNOSIS — R7989 Other specified abnormal findings of blood chemistry: Secondary | ICD-10-CM | POA: Diagnosis not present

## 2015-02-24 DIAGNOSIS — C61 Malignant neoplasm of prostate: Secondary | ICD-10-CM | POA: Diagnosis not present

## 2015-03-10 DIAGNOSIS — H25013 Cortical age-related cataract, bilateral: Secondary | ICD-10-CM | POA: Diagnosis not present

## 2015-03-10 DIAGNOSIS — H01003 Unspecified blepharitis right eye, unspecified eyelid: Secondary | ICD-10-CM | POA: Diagnosis not present

## 2015-03-10 DIAGNOSIS — H2513 Age-related nuclear cataract, bilateral: Secondary | ICD-10-CM | POA: Diagnosis not present

## 2015-03-10 DIAGNOSIS — H35362 Drusen (degenerative) of macula, left eye: Secondary | ICD-10-CM | POA: Diagnosis not present

## 2015-04-26 DIAGNOSIS — C61 Malignant neoplasm of prostate: Secondary | ICD-10-CM | POA: Diagnosis not present

## 2015-06-09 ENCOUNTER — Encounter: Payer: Self-pay | Admitting: Gastroenterology

## 2015-06-15 DIAGNOSIS — C61 Malignant neoplasm of prostate: Secondary | ICD-10-CM | POA: Diagnosis not present

## 2015-09-05 DIAGNOSIS — E1165 Type 2 diabetes mellitus with hyperglycemia: Secondary | ICD-10-CM | POA: Diagnosis not present

## 2015-09-06 DIAGNOSIS — C61 Malignant neoplasm of prostate: Secondary | ICD-10-CM | POA: Diagnosis not present

## 2015-09-09 DIAGNOSIS — E119 Type 2 diabetes mellitus without complications: Secondary | ICD-10-CM | POA: Diagnosis not present

## 2015-09-09 DIAGNOSIS — I10 Essential (primary) hypertension: Secondary | ICD-10-CM | POA: Diagnosis not present

## 2015-09-09 DIAGNOSIS — E785 Hyperlipidemia, unspecified: Secondary | ICD-10-CM | POA: Diagnosis not present

## 2015-10-04 DIAGNOSIS — C61 Malignant neoplasm of prostate: Secondary | ICD-10-CM | POA: Diagnosis not present

## 2018-06-24 ENCOUNTER — Ambulatory Visit: Payer: Self-pay | Admitting: Gastroenterology

## 2019-04-09 ENCOUNTER — Encounter: Payer: Self-pay | Admitting: Gastroenterology

## 2019-04-29 ENCOUNTER — Ambulatory Visit: Payer: Medicare Other | Admitting: *Deleted

## 2019-04-29 ENCOUNTER — Other Ambulatory Visit: Payer: Self-pay

## 2019-04-29 VITALS — Ht 65.0 in | Wt 150.0 lb

## 2019-04-29 DIAGNOSIS — Z8719 Personal history of other diseases of the digestive system: Secondary | ICD-10-CM

## 2019-04-29 NOTE — Progress Notes (Signed)

## 2019-05-06 ENCOUNTER — Telehealth: Payer: Self-pay | Admitting: Gastroenterology

## 2019-05-06 NOTE — Telephone Encounter (Signed)
Left message to call back regarding Covid-19 questions Covid-19 Screening Questions:  Do you now or have you had a fever in the last 14 days?   Do you have any respiratory symptoms of shortness of breath or cough now or in the last 14 days?   Do you have any family members or close contacts with diagnosed or suspected Covid-19 in the past 14 days?   Have you been tested for Covid-19 and found to be positive?

## 2019-05-06 NOTE — Telephone Encounter (Signed)
Patient is scheduled for EGD tomorrow so maybe he dropped it off with previsit.

## 2019-05-06 NOTE — Telephone Encounter (Signed)
Pt stated that he had dropped off a letter with his list of medications to the front desk last week.  FYI.

## 2019-05-07 ENCOUNTER — Other Ambulatory Visit: Payer: Self-pay

## 2019-05-07 ENCOUNTER — Ambulatory Visit (AMBULATORY_SURGERY_CENTER): Payer: Medicare Other | Admitting: Gastroenterology

## 2019-05-07 ENCOUNTER — Encounter: Payer: Self-pay | Admitting: Gastroenterology

## 2019-05-07 VITALS — BP 128/62 | HR 64 | Temp 98.7°F | Resp 16 | Ht 65.0 in | Wt 150.0 lb

## 2019-05-07 DIAGNOSIS — K3189 Other diseases of stomach and duodenum: Secondary | ICD-10-CM

## 2019-05-07 DIAGNOSIS — K2270 Barrett's esophagus without dysplasia: Secondary | ICD-10-CM

## 2019-05-07 DIAGNOSIS — K317 Polyp of stomach and duodenum: Secondary | ICD-10-CM

## 2019-05-07 DIAGNOSIS — B9681 Helicobacter pylori [H. pylori] as the cause of diseases classified elsewhere: Secondary | ICD-10-CM

## 2019-05-07 MED ORDER — SODIUM CHLORIDE 0.9 % IV SOLN: 500.0000 mL | Freq: Once | INTRAVENOUS | Status: DC

## 2019-05-07 NOTE — Op Note (Signed)
[facility] Patient Name: [name] Procedure Date: 05/07/2019 9:07 AM MRN: [id number] Endoscopist: Ladene Artist , MD Age: 73 Referring MD:  Date of Birth: 03-02-1946 Gender: Male Account #: [id number] Procedure:                Upper GI endoscopy Indications:              Surveillance for malignancy due to personal history                            of Barrett's esophagus Medicines:                Monitored Anesthesia Care Procedure:                Pre-Anesthesia Assessment:                           - Prior to the procedure, a History and Physical                            was performed, and patient medications and                            allergies were reviewed. The patient's tolerance of                            previous anesthesia was also reviewed. The risks                            and benefits of the procedure and the sedation                            options and risks were discussed with the patient.                            All questions were answered, and informed consent                            was obtained. Prior Anticoagulants: The patient has                            taken no previous anticoagulant or antiplatelet                            agents. ASA Grade Assessment: II - A patient with                            mild systemic disease. After reviewing the risks                            and benefits, the patient was deemed in                            satisfactory condition to undergo the procedure.  After obtaining informed consent, the endoscope was                            passed under direct vision. Throughout the                            procedure, the patient's blood pressure, pulse, and                            oxygen saturations were monitored continuously. The                            Endoscope was introduced through the mouth, and                            advanced to the second part of  duodenum. The upper                            GI endoscopy was accomplished without difficulty.                            The patient tolerated the procedure well. Scope In: Scope Out: Findings:                 There were esophageal mucosal changes secondary to                            established short-segment Barrett's disease present                            in the distal esophagus. The maximum longitudinal                            extent of these mucosal changes was 1 cm in length.                            Mucosa was biopsied with a cold forceps for                            histology in a targeted manner at intervals of 0.5                            cm in the lower third of the esophagus. One                            specimen bottle was sent to pathology. Z-line at 33                            cm.                           A single 5 mm mucosal nodule with a localized  distribution was found at the gastroesophageal                            junction, 33 cm from the incisors. Biopsies were                            taken with a cold forceps for histology.                           The exam of the esophagus was otherwise normal.                           A large hiatal hernia was present, 6 cm.                           A single 6 mm sessile polyp with no bleeding and no                            stigmata of recent bleeding was found in the                            cardia. The polyp was removed with a cold snare.                            Resection and retrieval were complete.                           The exam of the stomach was otherwise normal.                           A single 3 mm angiodysplastic lesion without                            bleeding was found in the second portion of the                            duodenum.                           The exam of the duodenum was otherwise normal. Complications:            No immediate  complications. Estimated Blood Loss:     Estimated blood loss was minimal. Impression:               - Esophageal mucosal changes secondary to                            established short-segment Barrett's disease.                            Biopsied.                           - Mucosal nodule found in the esophagus. Biopsied.                           -  Large hiatal hernia.                           - A single gastric polyp. Resected and retrieved.                           - A single non-bleeding angiodysplastic lesion in                            the duodenum. Recommendation:           - Patient has a contact number available for                            emergencies. The signs and symptoms of potential                            delayed complications were discussed with the                            patient. Return to normal activities tomorrow.                            Written discharge instructions were provided to the                            patient.                           - Resume previous diet.                           - Continue present medications.                           - Await pathology results.                           - Repeat upper endoscopy in 3 years for                            surveillance of Barrett's esophagus if no dysplasia. Ladene Artist, MD 05/07/2019 9:32:14 AM This report has been signed electronically.

## 2019-05-07 NOTE — Progress Notes (Signed)
Called to room to assist during endoscopic procedure.  Patient ID and intended procedure confirmed with present staff. Received instructions for my participation in the procedure from the performing physician.  

## 2019-05-07 NOTE — Patient Instructions (Addendum)
Thank you for allowing [place] to participate in your care today!  Await results from pathology, approximately 2 weeks.    If results normal, recommend next upper endoscopy in 3 years.  Dr Fuller Plan will indicate in his letter to you in 1-2 weeks.  Resume previous diet and medications today.  Return to your normal activities tomorrow.     OU HAD AN ENDOSCOPIC PROCEDURE TODAY AT [facility]:   Refer to the procedure report that was given to you for any specific questions about what was found during the examination.  If the procedure report does not answer your questions, please call your gastroenterologist to clarify.  If you requested that your care partner not be given the details of your procedure findings, then the procedure report has been included in a sealed envelope for you to review at your convenience later.  YOU SHOULD EXPECT: Some feelings of bloating in the abdomen. Passage of more gas than usual.  Walking can help get rid of the air that was put into your GI tract during the procedure and reduce the bloating. If you had a lower endoscopy (such as a colonoscopy or flexible sigmoidoscopy) you may notice spotting of blood in your stool or on the toilet paper. If you underwent a bowel prep for your procedure, you may not have a normal bowel movement for a few days.  Please Note:  You might notice some irritation and congestion in your nose or some drainage.  This is from the oxygen used during your procedure.  There is no need for concern and it should clear up in a day or so.  SYMPTOMS TO REPORT IMMEDIATELY:     Following upper endoscopy (EGD)  Vomiting of blood or coffee ground material  New chest pain or pain under the shoulder blades  Painful or persistently difficult swallowing  New shortness of breath  Fever of 100F or higher  Black, tarry-looking stools  For urgent or emergent issues, a gastroenterologist can be reached at any hour by calling [phone]  [phone].   DIET:  We do recommend a small meal at first, but then you may proceed to your regular diet.  Drink plenty of fluids but you should avoid alcoholic beverages for 24 hours.  ACTIVITY:  You should plan to take it easy for the rest of today and you should NOT DRIVE or use heavy machinery until tomorrow (because of the sedation medicines used during the test).    FOLLOW UP: Our staff will call the number listed on your records 48-72 hours following your procedure to check on you and address any questions or concerns that you may have regarding the information given to you following your procedure. If we do not reach you, we will leave a message.  We will attempt to reach you two times.  During this call, we will ask if you have developed any symptoms of COVID 19. If you develop any symptoms (ie: fever, flu-like symptoms, shortness of breath, cough etc.) before then, please call [phone].  If you test positive for Covid 19 in the 2 weeks post procedure, please call and report this information to [place].    If any biopsies were taken you will be contacted by phone or by letter within the next 1-3 weeks.  Please call us at [phone] if you have not heard about the biopsies in 3 weeks.    SIGNATURES/CONFIDENTIALITY: You and/or your care partner have signed paperwork which will be entered into your  electronic medical record.  These signatures attest to the fact that that the information above on your After Visit Summary has been reviewed and is understood.  Full responsibility of the confidentiality of this discharge information lies with you and/or your care-partner.

## 2019-05-07 NOTE — Progress Notes (Signed)
A and O x3. Report to RN. Tolerated MAC anesthesia well.Teeth unchanged after procedure.

## 2019-05-07 NOTE — Progress Notes (Signed)
Pt's states no medical or surgical changes since vitual previsit or office visit.  Pt hearing impaired -Interpreter w/ sign language with pt during enJtire admitting process -Carline Bussiere   Temp & covid sreening questions by JB @ front desk ,other vs ,consent , & Blood sugar by Bethena Roys in adm.

## 2019-05-11 ENCOUNTER — Telehealth: Payer: Self-pay | Admitting: *Deleted

## 2019-05-11 NOTE — Telephone Encounter (Signed)
Message left

## 2019-05-11 NOTE — Telephone Encounter (Signed)
Patient called back everything is good

## 2019-05-12 ENCOUNTER — Telehealth: Payer: Self-pay

## 2019-05-12 NOTE — Telephone Encounter (Signed)
No answer on 2nd follow up call.

## 2019-05-12 NOTE — Telephone Encounter (Signed)
Patient return call and stated everything is good. He is doing well no problems

## 2019-05-21 ENCOUNTER — Other Ambulatory Visit: Payer: Self-pay

## 2019-05-21 ENCOUNTER — Telehealth: Payer: Self-pay | Admitting: Gastroenterology

## 2019-05-21 NOTE — Telephone Encounter (Signed)
Pt stated that he is returning call regarding lab results.

## 2019-05-21 NOTE — Telephone Encounter (Signed)
See results notes for more details.

## 2019-05-27 MED ORDER — PYLERA 140-125-125 MG PO CAPS: 3.0000 | ORAL_CAPSULE | Freq: Three times a day (TID) | ORAL | 0 refills | Status: DC

## 2019-05-27 NOTE — Telephone Encounter (Signed)
Prescription for Pylera was resent to patient's pharmacy.

## 2019-05-27 NOTE — Telephone Encounter (Signed)
[facility] on [address] stated that they have not received prescription for pt.

## 2019-06-17 ENCOUNTER — Telehealth: Payer: Self-pay | Admitting: Gastroenterology

## 2019-06-17 NOTE — Telephone Encounter (Signed)
Left a voicemail on interpreter line telling patient that once he has finished treatment for H. Pylori then they do not need a refill of Pylera. The medication eradicates the disease and should not need further treatment. Patient called and confirmed voicemail.

## 2020-01-13 DIAGNOSIS — E785 Hyperlipidemia, unspecified: Secondary | ICD-10-CM | POA: Diagnosis not present

## 2020-01-13 DIAGNOSIS — R946 Abnormal results of thyroid function studies: Secondary | ICD-10-CM | POA: Diagnosis not present

## 2020-01-13 DIAGNOSIS — Z0000 Encounter for general adult medical examination without abnormal findings: Secondary | ICD-10-CM | POA: Diagnosis not present

## 2020-01-13 DIAGNOSIS — Z8546 Personal history of malignant neoplasm of prostate: Secondary | ICD-10-CM | POA: Diagnosis not present

## 2020-01-13 DIAGNOSIS — E119 Type 2 diabetes mellitus without complications: Secondary | ICD-10-CM | POA: Diagnosis not present

## 2020-01-13 DIAGNOSIS — D509 Iron deficiency anemia, unspecified: Secondary | ICD-10-CM | POA: Diagnosis not present

## 2020-01-13 DIAGNOSIS — I10 Essential (primary) hypertension: Secondary | ICD-10-CM | POA: Diagnosis not present

## 2020-01-13 DIAGNOSIS — K2270 Barrett's esophagus without dysplasia: Secondary | ICD-10-CM | POA: Diagnosis not present

## 2020-04-07 DIAGNOSIS — E559 Vitamin D deficiency, unspecified: Secondary | ICD-10-CM | POA: Diagnosis not present

## 2020-04-07 DIAGNOSIS — C61 Malignant neoplasm of prostate: Secondary | ICD-10-CM | POA: Diagnosis not present

## 2020-04-07 DIAGNOSIS — M8580 Other specified disorders of bone density and structure, unspecified site: Secondary | ICD-10-CM | POA: Diagnosis not present

## 2020-04-19 ENCOUNTER — Other Ambulatory Visit (HOSPITAL_COMMUNITY): Payer: Self-pay | Admitting: Urology

## 2020-04-19 ENCOUNTER — Other Ambulatory Visit: Payer: Self-pay | Admitting: Urology

## 2020-04-19 DIAGNOSIS — C61 Malignant neoplasm of prostate: Secondary | ICD-10-CM

## 2020-06-14 DIAGNOSIS — H25013 Cortical age-related cataract, bilateral: Secondary | ICD-10-CM | POA: Diagnosis not present

## 2020-06-14 DIAGNOSIS — H35033 Hypertensive retinopathy, bilateral: Secondary | ICD-10-CM | POA: Diagnosis not present

## 2020-06-14 DIAGNOSIS — H2513 Age-related nuclear cataract, bilateral: Secondary | ICD-10-CM | POA: Diagnosis not present

## 2020-06-14 DIAGNOSIS — H35363 Drusen (degenerative) of macula, bilateral: Secondary | ICD-10-CM | POA: Diagnosis not present

## 2020-07-13 DIAGNOSIS — I10 Essential (primary) hypertension: Secondary | ICD-10-CM | POA: Diagnosis not present

## 2020-07-13 DIAGNOSIS — D509 Iron deficiency anemia, unspecified: Secondary | ICD-10-CM | POA: Diagnosis not present

## 2020-07-13 DIAGNOSIS — K2270 Barrett's esophagus without dysplasia: Secondary | ICD-10-CM | POA: Diagnosis not present

## 2020-07-13 DIAGNOSIS — E119 Type 2 diabetes mellitus without complications: Secondary | ICD-10-CM | POA: Diagnosis not present

## 2020-07-13 DIAGNOSIS — E785 Hyperlipidemia, unspecified: Secondary | ICD-10-CM | POA: Diagnosis not present

## 2020-07-20 DIAGNOSIS — K2270 Barrett's esophagus without dysplasia: Secondary | ICD-10-CM | POA: Diagnosis not present

## 2020-07-20 DIAGNOSIS — E119 Type 2 diabetes mellitus without complications: Secondary | ICD-10-CM | POA: Diagnosis not present

## 2020-07-20 DIAGNOSIS — D509 Iron deficiency anemia, unspecified: Secondary | ICD-10-CM | POA: Diagnosis not present

## 2020-07-20 DIAGNOSIS — Z8546 Personal history of malignant neoplasm of prostate: Secondary | ICD-10-CM | POA: Diagnosis not present

## 2020-07-20 DIAGNOSIS — Z0000 Encounter for general adult medical examination without abnormal findings: Secondary | ICD-10-CM | POA: Diagnosis not present

## 2020-07-20 DIAGNOSIS — E785 Hyperlipidemia, unspecified: Secondary | ICD-10-CM | POA: Diagnosis not present

## 2020-07-20 DIAGNOSIS — H35039 Hypertensive retinopathy, unspecified eye: Secondary | ICD-10-CM | POA: Diagnosis not present

## 2020-07-21 DIAGNOSIS — C61 Malignant neoplasm of prostate: Secondary | ICD-10-CM | POA: Diagnosis not present

## 2020-07-25 ENCOUNTER — Encounter (HOSPITAL_COMMUNITY)
Admission: RE | Admit: 2020-07-25 | Discharge: 2020-07-25 | Disposition: A | Discharge status: Home / Self Care | Payer: Medicare PPO | Source: Ambulatory Visit | Attending: Urology | Admitting: Urology

## 2020-07-25 ENCOUNTER — Other Ambulatory Visit: Payer: Self-pay

## 2020-07-25 DIAGNOSIS — I700 Atherosclerosis of aorta: Secondary | ICD-10-CM | POA: Diagnosis not present

## 2020-07-25 DIAGNOSIS — C61 Malignant neoplasm of prostate: Secondary | ICD-10-CM | POA: Diagnosis not present

## 2020-07-25 DIAGNOSIS — K8020 Calculus of gallbladder without cholecystitis without obstruction: Secondary | ICD-10-CM | POA: Diagnosis not present

## 2020-07-25 DIAGNOSIS — Z8546 Personal history of malignant neoplasm of prostate: Secondary | ICD-10-CM | POA: Diagnosis not present

## 2020-07-25 DIAGNOSIS — K5730 Diverticulosis of large intestine without perforation or abscess without bleeding: Secondary | ICD-10-CM | POA: Diagnosis not present

## 2020-07-25 DIAGNOSIS — K449 Diaphragmatic hernia without obstruction or gangrene: Secondary | ICD-10-CM | POA: Diagnosis not present

## 2020-07-25 MED ORDER — TECHNETIUM TC 99M MEDRONATE IV KIT
20.0000 | PACK | Freq: Once | INTRAVENOUS | Status: AC | PRN
  Administered 2020-07-25: 21.9 via INTRAVENOUS

## 2020-07-28 DIAGNOSIS — C61 Malignant neoplasm of prostate: Secondary | ICD-10-CM | POA: Diagnosis not present

## 2020-07-28 DIAGNOSIS — M8580 Other specified disorders of bone density and structure, unspecified site: Secondary | ICD-10-CM | POA: Diagnosis not present

## 2020-09-22 DIAGNOSIS — H02831 Dermatochalasis of right upper eyelid: Secondary | ICD-10-CM | POA: Diagnosis not present

## 2020-09-22 DIAGNOSIS — H02834 Dermatochalasis of left upper eyelid: Secondary | ICD-10-CM | POA: Diagnosis not present

## 2020-09-22 DIAGNOSIS — H02835 Dermatochalasis of left lower eyelid: Secondary | ICD-10-CM | POA: Diagnosis not present

## 2020-09-22 DIAGNOSIS — H02832 Dermatochalasis of right lower eyelid: Secondary | ICD-10-CM | POA: Diagnosis not present

## 2020-10-27 DIAGNOSIS — C61 Malignant neoplasm of prostate: Secondary | ICD-10-CM | POA: Diagnosis not present

## 2020-11-03 ENCOUNTER — Other Ambulatory Visit: Payer: Self-pay | Admitting: Urology

## 2020-11-03 DIAGNOSIS — M8580 Other specified disorders of bone density and structure, unspecified site: Secondary | ICD-10-CM

## 2020-11-03 DIAGNOSIS — C61 Malignant neoplasm of prostate: Secondary | ICD-10-CM | POA: Diagnosis not present

## 2021-01-10 DIAGNOSIS — I10 Essential (primary) hypertension: Secondary | ICD-10-CM | POA: Diagnosis not present

## 2021-01-10 DIAGNOSIS — E119 Type 2 diabetes mellitus without complications: Secondary | ICD-10-CM | POA: Diagnosis not present

## 2021-01-10 DIAGNOSIS — D509 Iron deficiency anemia, unspecified: Secondary | ICD-10-CM | POA: Diagnosis not present

## 2021-01-10 DIAGNOSIS — E785 Hyperlipidemia, unspecified: Secondary | ICD-10-CM | POA: Diagnosis not present

## 2021-01-18 DIAGNOSIS — E119 Type 2 diabetes mellitus without complications: Secondary | ICD-10-CM | POA: Diagnosis not present

## 2021-01-18 DIAGNOSIS — I10 Essential (primary) hypertension: Secondary | ICD-10-CM | POA: Diagnosis not present

## 2021-01-18 DIAGNOSIS — K2270 Barrett's esophagus without dysplasia: Secondary | ICD-10-CM | POA: Diagnosis not present

## 2021-01-18 DIAGNOSIS — D509 Iron deficiency anemia, unspecified: Secondary | ICD-10-CM | POA: Diagnosis not present

## 2021-01-18 DIAGNOSIS — E785 Hyperlipidemia, unspecified: Secondary | ICD-10-CM | POA: Diagnosis not present

## 2021-01-18 DIAGNOSIS — Z8546 Personal history of malignant neoplasm of prostate: Secondary | ICD-10-CM | POA: Diagnosis not present

## 2021-02-02 DIAGNOSIS — C61 Malignant neoplasm of prostate: Secondary | ICD-10-CM | POA: Diagnosis not present

## 2021-02-09 DIAGNOSIS — C61 Malignant neoplasm of prostate: Secondary | ICD-10-CM | POA: Diagnosis not present

## 2021-02-09 DIAGNOSIS — M8580 Other specified disorders of bone density and structure, unspecified site: Secondary | ICD-10-CM | POA: Diagnosis not present

## 2021-02-21 ENCOUNTER — Other Ambulatory Visit: Payer: Self-pay

## 2021-02-21 ENCOUNTER — Ambulatory Visit
Admission: RE | Admit: 2021-02-21 | Discharge: 2021-02-21 | Disposition: A | Discharge status: Home / Self Care | Payer: Medicare PPO | Source: Ambulatory Visit | Attending: Urology | Admitting: Urology

## 2021-02-21 DIAGNOSIS — M810 Age-related osteoporosis without current pathological fracture: Secondary | ICD-10-CM | POA: Diagnosis not present

## 2021-02-21 DIAGNOSIS — M8580 Other specified disorders of bone density and structure, unspecified site: Secondary | ICD-10-CM

## 2021-05-26 DIAGNOSIS — C61 Malignant neoplasm of prostate: Secondary | ICD-10-CM | POA: Diagnosis not present

## 2021-06-01 DIAGNOSIS — C61 Malignant neoplasm of prostate: Secondary | ICD-10-CM | POA: Diagnosis not present

## 2021-06-01 DIAGNOSIS — M818 Other osteoporosis without current pathological fracture: Secondary | ICD-10-CM | POA: Diagnosis not present

## 2021-06-20 DIAGNOSIS — H35033 Hypertensive retinopathy, bilateral: Secondary | ICD-10-CM | POA: Diagnosis not present

## 2021-06-20 DIAGNOSIS — H35363 Drusen (degenerative) of macula, bilateral: Secondary | ICD-10-CM | POA: Diagnosis not present

## 2021-06-20 DIAGNOSIS — H25013 Cortical age-related cataract, bilateral: Secondary | ICD-10-CM | POA: Diagnosis not present

## 2021-06-20 DIAGNOSIS — H2513 Age-related nuclear cataract, bilateral: Secondary | ICD-10-CM | POA: Diagnosis not present

## 2021-07-19 DIAGNOSIS — E119 Type 2 diabetes mellitus without complications: Secondary | ICD-10-CM | POA: Diagnosis not present

## 2021-07-19 DIAGNOSIS — E785 Hyperlipidemia, unspecified: Secondary | ICD-10-CM | POA: Diagnosis not present

## 2021-07-19 DIAGNOSIS — I10 Essential (primary) hypertension: Secondary | ICD-10-CM | POA: Diagnosis not present

## 2021-07-26 DIAGNOSIS — K2270 Barrett's esophagus without dysplasia: Secondary | ICD-10-CM | POA: Diagnosis not present

## 2021-07-26 DIAGNOSIS — D509 Iron deficiency anemia, unspecified: Secondary | ICD-10-CM | POA: Diagnosis not present

## 2021-07-26 DIAGNOSIS — H35039 Hypertensive retinopathy, unspecified eye: Secondary | ICD-10-CM | POA: Diagnosis not present

## 2021-07-26 DIAGNOSIS — R7989 Other specified abnormal findings of blood chemistry: Secondary | ICD-10-CM | POA: Diagnosis not present

## 2021-07-26 DIAGNOSIS — K449 Diaphragmatic hernia without obstruction or gangrene: Secondary | ICD-10-CM | POA: Diagnosis not present

## 2021-07-26 DIAGNOSIS — E119 Type 2 diabetes mellitus without complications: Secondary | ICD-10-CM | POA: Diagnosis not present

## 2021-07-26 DIAGNOSIS — Z0000 Encounter for general adult medical examination without abnormal findings: Secondary | ICD-10-CM | POA: Diagnosis not present

## 2021-07-26 DIAGNOSIS — E785 Hyperlipidemia, unspecified: Secondary | ICD-10-CM | POA: Diagnosis not present

## 2021-07-26 DIAGNOSIS — I10 Essential (primary) hypertension: Secondary | ICD-10-CM | POA: Diagnosis not present

## 2021-08-31 DIAGNOSIS — C61 Malignant neoplasm of prostate: Secondary | ICD-10-CM | POA: Diagnosis not present

## 2021-09-07 DIAGNOSIS — C61 Malignant neoplasm of prostate: Secondary | ICD-10-CM | POA: Diagnosis not present

## 2021-09-07 DIAGNOSIS — M818 Other osteoporosis without current pathological fracture: Secondary | ICD-10-CM | POA: Diagnosis not present

## 2021-12-07 DIAGNOSIS — C61 Malignant neoplasm of prostate: Secondary | ICD-10-CM | POA: Diagnosis not present

## 2021-12-14 DIAGNOSIS — N5201 Erectile dysfunction due to arterial insufficiency: Secondary | ICD-10-CM | POA: Diagnosis not present

## 2021-12-14 DIAGNOSIS — M818 Other osteoporosis without current pathological fracture: Secondary | ICD-10-CM | POA: Diagnosis not present

## 2021-12-14 DIAGNOSIS — C61 Malignant neoplasm of prostate: Secondary | ICD-10-CM | POA: Diagnosis not present

## 2021-12-14 DIAGNOSIS — Z5111 Encounter for antineoplastic chemotherapy: Secondary | ICD-10-CM | POA: Diagnosis not present

## 2022-01-18 DIAGNOSIS — R7989 Other specified abnormal findings of blood chemistry: Secondary | ICD-10-CM | POA: Diagnosis not present

## 2022-01-18 DIAGNOSIS — E785 Hyperlipidemia, unspecified: Secondary | ICD-10-CM | POA: Diagnosis not present

## 2022-01-18 DIAGNOSIS — H35039 Hypertensive retinopathy, unspecified eye: Secondary | ICD-10-CM | POA: Diagnosis not present

## 2022-01-18 DIAGNOSIS — K2270 Barrett's esophagus without dysplasia: Secondary | ICD-10-CM | POA: Diagnosis not present

## 2022-01-18 DIAGNOSIS — D509 Iron deficiency anemia, unspecified: Secondary | ICD-10-CM | POA: Diagnosis not present

## 2022-01-18 DIAGNOSIS — E119 Type 2 diabetes mellitus without complications: Secondary | ICD-10-CM | POA: Diagnosis not present

## 2022-01-18 DIAGNOSIS — I10 Essential (primary) hypertension: Secondary | ICD-10-CM | POA: Diagnosis not present

## 2022-01-25 DIAGNOSIS — D509 Iron deficiency anemia, unspecified: Secondary | ICD-10-CM | POA: Diagnosis not present

## 2022-01-25 DIAGNOSIS — C61 Malignant neoplasm of prostate: Secondary | ICD-10-CM | POA: Diagnosis not present

## 2022-01-25 DIAGNOSIS — E1169 Type 2 diabetes mellitus with other specified complication: Secondary | ICD-10-CM | POA: Diagnosis not present

## 2022-01-25 DIAGNOSIS — E785 Hyperlipidemia, unspecified: Secondary | ICD-10-CM | POA: Diagnosis not present

## 2022-01-25 DIAGNOSIS — H35039 Hypertensive retinopathy, unspecified eye: Secondary | ICD-10-CM | POA: Diagnosis not present

## 2022-01-25 DIAGNOSIS — K2270 Barrett's esophagus without dysplasia: Secondary | ICD-10-CM | POA: Diagnosis not present

## 2022-01-25 DIAGNOSIS — R946 Abnormal results of thyroid function studies: Secondary | ICD-10-CM | POA: Diagnosis not present

## 2022-01-25 DIAGNOSIS — I10 Essential (primary) hypertension: Secondary | ICD-10-CM | POA: Diagnosis not present

## 2022-02-26 DIAGNOSIS — C61 Malignant neoplasm of prostate: Secondary | ICD-10-CM | POA: Diagnosis not present

## 2022-03-08 DIAGNOSIS — C61 Malignant neoplasm of prostate: Secondary | ICD-10-CM | POA: Diagnosis not present

## 2022-06-07 DIAGNOSIS — C61 Malignant neoplasm of prostate: Secondary | ICD-10-CM | POA: Diagnosis not present

## 2022-06-13 ENCOUNTER — Encounter: Payer: Self-pay | Admitting: Gastroenterology

## 2022-06-14 DIAGNOSIS — M818 Other osteoporosis without current pathological fracture: Secondary | ICD-10-CM | POA: Diagnosis not present

## 2022-06-14 DIAGNOSIS — C61 Malignant neoplasm of prostate: Secondary | ICD-10-CM | POA: Diagnosis not present

## 2022-06-19 DIAGNOSIS — H35033 Hypertensive retinopathy, bilateral: Secondary | ICD-10-CM | POA: Diagnosis not present

## 2022-06-19 DIAGNOSIS — H35363 Drusen (degenerative) of macula, bilateral: Secondary | ICD-10-CM | POA: Diagnosis not present

## 2022-06-19 DIAGNOSIS — H25813 Combined forms of age-related cataract, bilateral: Secondary | ICD-10-CM | POA: Diagnosis not present

## 2022-06-21 ENCOUNTER — Other Ambulatory Visit (HOSPITAL_COMMUNITY): Payer: Self-pay | Admitting: Urology

## 2022-06-21 ENCOUNTER — Other Ambulatory Visit: Payer: Self-pay | Admitting: Urology

## 2022-06-21 DIAGNOSIS — C61 Malignant neoplasm of prostate: Secondary | ICD-10-CM

## 2022-07-19 DIAGNOSIS — E785 Hyperlipidemia, unspecified: Secondary | ICD-10-CM | POA: Diagnosis not present

## 2022-07-19 DIAGNOSIS — R946 Abnormal results of thyroid function studies: Secondary | ICD-10-CM | POA: Diagnosis not present

## 2022-07-19 DIAGNOSIS — H35039 Hypertensive retinopathy, unspecified eye: Secondary | ICD-10-CM | POA: Diagnosis not present

## 2022-07-19 DIAGNOSIS — D509 Iron deficiency anemia, unspecified: Secondary | ICD-10-CM | POA: Diagnosis not present

## 2022-07-19 DIAGNOSIS — E119 Type 2 diabetes mellitus without complications: Secondary | ICD-10-CM | POA: Diagnosis not present

## 2022-07-19 DIAGNOSIS — I10 Essential (primary) hypertension: Secondary | ICD-10-CM | POA: Diagnosis not present

## 2022-07-19 DIAGNOSIS — K2270 Barrett's esophagus without dysplasia: Secondary | ICD-10-CM | POA: Diagnosis not present

## 2022-07-26 DIAGNOSIS — E118 Type 2 diabetes mellitus with unspecified complications: Secondary | ICD-10-CM | POA: Diagnosis not present

## 2022-07-26 DIAGNOSIS — E785 Hyperlipidemia, unspecified: Secondary | ICD-10-CM | POA: Diagnosis not present

## 2022-07-26 DIAGNOSIS — H35039 Hypertensive retinopathy, unspecified eye: Secondary | ICD-10-CM | POA: Diagnosis not present

## 2022-07-26 DIAGNOSIS — D509 Iron deficiency anemia, unspecified: Secondary | ICD-10-CM | POA: Diagnosis not present

## 2022-07-26 DIAGNOSIS — K2270 Barrett's esophagus without dysplasia: Secondary | ICD-10-CM | POA: Diagnosis not present

## 2022-07-26 DIAGNOSIS — I10 Essential (primary) hypertension: Secondary | ICD-10-CM | POA: Diagnosis not present

## 2022-07-26 DIAGNOSIS — Z0000 Encounter for general adult medical examination without abnormal findings: Secondary | ICD-10-CM | POA: Diagnosis not present

## 2022-08-28 ENCOUNTER — Encounter: Payer: Self-pay | Admitting: Gastroenterology

## 2022-08-28 ENCOUNTER — Ambulatory Visit: Payer: Medicare PPO | Admitting: Gastroenterology

## 2022-08-28 VITALS — BP 128/74 | HR 64 | Ht 64.0 in | Wt 152.5 lb

## 2022-08-28 DIAGNOSIS — K2270 Barrett's esophagus without dysplasia: Secondary | ICD-10-CM

## 2022-08-28 NOTE — Progress Notes (Signed)
Assessment    GERD, well controlled Short segment Barrett's esophagus without dysplasia History of H. pylori gastritis CRC screening. His last colonoscopy was normal, no future screening colonoscopies due to age and absence of polyps on his last colonoscopy  Recommendations   Closely follow antireflux measures and continue omeprazole 20 mg daily Schedule surveillance EGD. The risks (including bleeding, perforation, infection, missed lesions, medication reactions and possible hospitalization or surgery if complications occur), benefits, and alternatives to endoscopy with possible biopsy and possible dilation were discussed with the patient and they consent to proceed.     HPI   Chief complaint: GERD, Barrett's esophagus  Patient profile:  [name] is a 76 y.o. male referred by Janie Morning, DO for follow up of GERD and short segment Barrett's esophagus without dysplasia.  He is deaf and is accompanied by his wife who is deaf.  A sign interpreter was present for the entire interview.  He has rare breakthrough reflux symptoms with certain spices on omeprazole 20 mg daily.  Otherwise his reflux symptoms are well controlled.  Denies weight loss, abdominal pain, constipation, diarrhea, change in stool caliber, melena, hematochezia, nausea, vomiting, dysphagia, chest pain.    Previous Labs / Imaging::    Latest Ref Rng & Units 12/26/2013   11:39 AM 05/28/2007   11:42 AM 05/28/2007   11:20 AM  CBC  WBC 4.0 - 10.5 K/uL 8.8   9.1   Hemoglobin 13.0 - 17.0 g/dL 10.9  16.6  15.1   Hematocrit 39.0 - 52.0 % 33.3   44.2   Platelets 150 - 400 K/uL 190   182     No results found for: "LIPASE"    Latest Ref Rng & Units 12/26/2013   11:39 AM 05/28/2007   11:20 AM  CMP  Glucose 70 - 99 mg/dL 126  96   BUN 6 - 23 mg/dL 13  12   Creatinine 0.50 - 1.35 mg/dL 0.95  1.00   Sodium 137 - 147 mEq/L 138  139   Potassium 3.7 - 5.3 mEq/L 4.0  3.8   Chloride 96 - 112 mEq/L 101  101   CO2 19 - 32  mEq/L 24  27   Calcium 8.4 - 10.5 mg/dL 9.5  9.7   Total Protein   7.8   Total Bilirubin   1.2   Alkaline Phos   73   AST   34   ALT   35      Previous GI evaluation    Endoscopies:  EGD July 2020 Esophageal mucosal changes secondary to established short-segment Barrett's disease. Biopsied. - Mucosal nodule found in the esophagus. Biopsied. - Large hiatal hernia. - A single gastric polyp. Resected and retrieved. - A single non-bleeding angiodysplastic lesion in the duodenum.  Colonoscopy June 2015 - Mild diverticulosis - Moderate internal hemorrhoids  Imaging:     Past Medical History:  Diagnosis Date   Barrett's esophagus    Deafness    Diabetes mellitus, stable ([place])    prediabetic   GERD (gastroesophageal reflux disease)    Hiatal hernia    Hyperlipidemia    Hypertension    Iron deficiency anemia    Prostate cancer ([place])    prostate Ca 2006 and 2008   Past Surgical History:  Procedure Laterality Date   COLONOSCOPY     ORIF WRIST FRACTURE Left 12/26/2013   Procedure: OPEN REDUCTION INTERNAL FIXATION (ORIF) WRIST FRACTURE;  Surgeon: Linna Hoff, MD;  Location: [facility];  Service: Orthopedics;  Laterality: Left;   PROSTATE SURGERY  2006 and 2008   x 2   Family History  Problem Relation Age of Onset   Diabetes Maternal Grandfather    Lung cancer Father    Social History   Tobacco Use   Smoking status: Former    Types: Pipe    Quit date: 11/05/2005    Years since quitting: 16.8   Smokeless tobacco: Never  Vaping Use   Vaping Use: Never used  Substance Use Topics   Alcohol use: Yes    Comment: occ social drink-wine   Drug use: No   Current Outpatient Medications  Medication Sig Dispense Refill   atorvastatin (LIPITOR) 40 MG tablet Take 20 mg by mouth daily at 6 PM.      bismuth-metronidazole-tetracycline (PYLERA) 140-125-125 MG capsule Take 3 capsules by mouth 4 (four) times daily -  before meals and at bedtime for 14 days. 168 capsule 0    cholecalciferol (VITAMIN D) 1000 UNITS tablet Take 1,000 Units by mouth daily.     Chromium 1000 MCG TABS Take 1,000 mcg by mouth daily.     enzalutamide (XTANDI) 40 MG capsule Take 160 mg by mouth daily.     losartan (COZAAR) 100 MG tablet Take 100 mg by mouth daily.      metFORMIN (GLUCOPHAGE-XR) 500 MG 24 hr tablet TK 1 T PO BID WITH MEALS     methocarbamol (ROBAXIN) 500 MG tablet Take 1 tablet (500 mg total) by mouth 4 (four) times daily. (Patient not taking: Reported on 04/29/2019) 30 tablet 0   Omega-3 Fatty Acids (FISH OIL) 1200 MG CAPS Take 1,200 mg by mouth daily.     omeprazole (PRILOSEC) 20 MG capsule Take 20 mg by mouth daily.     No current facility-administered medications for this visit.   No Known Allergies  Review of Systems: All other systems reviewed and negative except where noted in HPI.    Physical Exam    Wt Readings from Last 3 Encounters:  08/28/22 152 lb 8 oz (69.2 kg)  05/07/19 150 lb (68 kg)  04/29/19 150 lb (68 kg)    Ht '5\' 4"'$  (1.626 m) Comment: with shoes  Wt 152 lb 8 oz (69.2 kg)   BMI 26.18 kg/m  Constitutional:  Generally well appearing male in no acute distress. Psychiatric: Pleasant. Normal mood and affect. Behavior is normal. HEENT: Pupils normal.  Conjunctivae are normal. No scleral icterus. Neck supple.  Cardiovascular: Normal rate, regular rhythm. No edema Pulmonary/chest: Effort normal and breath sounds normal. No wheezing, rales or rhonchi. Abdominal: Soft, nondistended, nontender. Bowel sounds active throughout. There are no masses palpable. No hepatomegaly. Rectal: Not done Neurological: Alert and oriented to person place and time. Skin: Skin is warm and dry. No rashes noted.  Lucio Edward, MD   cc:  Referring Provider Janie Morning, DO

## 2022-08-28 NOTE — Patient Instructions (Signed)
You have been scheduled for an endoscopy. Please follow written instructions given to you at your visit today. If you use inhalers (even only as needed), please bring them with you on the day of your procedure.  The [facility] providers would like to encourage you to use MYCHART to communicate with providers for non-urgent requests or questions.  Due to long hold times on the telephone, sending your provider a message by MYCHART may be a faster and more efficient way to get a response.  Please allow 48 business hours for a response.  Please remember that this is for non-urgent requests.   Due to recent changes in healthcare laws, you may see the results of your imaging and laboratory studies on MyChart before your provider has had a chance to review them.  We understand that in some cases there may be results that are confusing or concerning to you. Not all laboratory results come back in the same time frame and the provider may be waiting for multiple results in order to interpret others.  Please give us 48 hours in order for your provider to thoroughly review all the results before contacting the office for clarification of your results.   Thank you for choosing me and [facility].  Malcolm T. Stark, Jr., MD., FACG  

## 2022-09-10 ENCOUNTER — Ambulatory Visit (AMBULATORY_SURGERY_CENTER): Payer: Medicare PPO | Admitting: Gastroenterology

## 2022-09-10 ENCOUNTER — Encounter: Payer: Self-pay | Admitting: Gastroenterology

## 2022-09-10 VITALS — BP 132/63 | HR 63 | Temp 97.1°F | Resp 16 | Ht 64.0 in | Wt 152.0 lb

## 2022-09-10 DIAGNOSIS — K2950 Unspecified chronic gastritis without bleeding: Secondary | ICD-10-CM | POA: Diagnosis not present

## 2022-09-10 DIAGNOSIS — K209 Esophagitis, unspecified without bleeding: Secondary | ICD-10-CM | POA: Diagnosis not present

## 2022-09-10 DIAGNOSIS — K449 Diaphragmatic hernia without obstruction or gangrene: Secondary | ICD-10-CM | POA: Diagnosis not present

## 2022-09-10 DIAGNOSIS — K2270 Barrett's esophagus without dysplasia: Secondary | ICD-10-CM

## 2022-09-10 DIAGNOSIS — K219 Gastro-esophageal reflux disease without esophagitis: Secondary | ICD-10-CM | POA: Diagnosis not present

## 2022-09-10 HISTORY — PX: ESOPHAGOGASTRODUODENOSCOPY: SHX1529

## 2022-09-10 MED ORDER — SODIUM CHLORIDE 0.9 % IV SOLN: 500.0000 mL | Freq: Once | INTRAVENOUS | Status: DC

## 2022-09-10 NOTE — Op Note (Signed)
[facility] Patient Name: [name] Procedure Date: 09/10/2022 2:37 PM MRN: [id number] Endoscopist: Ladene Artist , MD, [id number] Age: 76 Referring MD:  Date of Birth: Oct 08, 1946 Gender: Male Account #: [id number] Procedure:                Upper GI endoscopy Indications:              Surveillance for malignancy due to personal history                            of Barrett's esophagus Medicines:                Monitored Anesthesia Care Procedure:                Pre-Anesthesia Assessment:                           - Prior to the procedure, a History and Physical                            was performed, and patient medications and                            allergies were reviewed. The patient's tolerance of                            previous anesthesia was also reviewed. The risks                            and benefits of the procedure and the sedation                            options and risks were discussed with the patient.                            All questions were answered, and informed consent                            was obtained. Prior Anticoagulants: The patient has                            taken no anticoagulant or antiplatelet agents. ASA                            Grade Assessment: II - A patient with mild systemic                            disease. After reviewing the risks and benefits,                            the patient was deemed in satisfactory condition to                            undergo the procedure.  After obtaining informed consent, the endoscope was                            passed under direct vision. Throughout the                            procedure, the patient's blood pressure, pulse, and                            oxygen saturations were monitored continuously. The                            Endoscope was introduced through the mouth, and                            advanced to the second part of  duodenum. The upper                            GI endoscopy was accomplished without difficulty.                            The patient tolerated the procedure well. Scope In: Scope Out: Findings:                 There were esophageal mucosal changes secondary to                            established short-segment Barrett's disease present                            in the distal esophagus. The maximum longitudinal                            extent of these mucosal changes was 1 cm in length.                            Mucosa was biopsied with a cold forceps for                            histology in a targeted manner at intervals of 0.5                            cm at the gastroesophageal junction. One specimen                            bottle was sent to pathology.                           The exam of the esophagus was otherwise normal.                           A large hiatal hernia was present.  The exam of the stomach was otherwise normal.                           The duodenal bulb and second portion of the                            duodenum were normal. Complications:            No immediate complications. Estimated Blood Loss:     Estimated blood loss was minimal. Impression:               - Esophageal mucosal changes secondary to                            established short-segment Barrett's disease.                            Biopsied.                           - Large hiatal hernia.                           - Normal duodenal bulb and second portion of the                            duodenum. Recommendation:           - Patient has a contact number available for                            emergencies. The signs and symptoms of potential                            delayed complications were discussed with the                            patient. Return to normal activities tomorrow.                            Written discharge instructions were  provided to the                            patient.                           - Resume previous diet.                           - Follow antireflux measures.                           - Continue present medications.                           - Await pathology results.                           -  If no dysplasia then no plans for repeat EGD for                            Barrett's surveillance due to age. Ladene Artist, MD 09/10/2022 3:07:04 PM This report has been signed electronically.

## 2022-09-10 NOTE — Progress Notes (Signed)
Sedate, gd SR, tolerated procedure well, VSS, report to RN 

## 2022-09-10 NOTE — Progress Notes (Signed)
Called to room to assist during endoscopic procedure.  Patient ID and intended procedure confirmed with present staff. Received instructions for my participation in the procedure from the performing physician.  

## 2022-09-10 NOTE — Progress Notes (Signed)
Pt's states no medical or surgical changes since previsit or office visit. 

## 2022-09-10 NOTE — Patient Instructions (Signed)
Await pathology results from the biopsies taken today.  Handouts on hiatal hernia and antireflux diet given to you.  Continue present medications.  If no dysplasia, then no plans for repeat EGD due to age.   YOU HAD AN ENDOSCOPIC PROCEDURE TODAY AT [facility]:   Refer to the procedure report that was given to you for any specific questions about what was found during the examination.  If the procedure report does not answer your questions, please call your gastroenterologist to clarify.  If you requested that your care partner not be given the details of your procedure findings, then the procedure report has been included in a sealed envelope for you to review at your convenience later.  YOU SHOULD EXPECT: Some feelings of bloating in the abdomen. Passage of more gas than usual.  Walking can help get rid of the air that was put into your GI tract during the procedure and reduce the bloating. If you had a lower endoscopy (such as a colonoscopy or flexible sigmoidoscopy) you may notice spotting of blood in your stool or on the toilet paper. If you underwent a bowel prep for your procedure, you may not have a normal bowel movement for a few days.  Please Note:  You might notice some irritation and congestion in your nose or some drainage.  This is from the oxygen used during your procedure.  There is no need for concern and it should clear up in a day or so.  SYMPTOMS TO REPORT IMMEDIATELY: Following upper endoscopy (EGD)  Vomiting of blood or coffee ground material  New chest pain or pain under the shoulder blades  Painful or persistently difficult swallowing  New shortness of breath  Fever of 100F or higher  Black, tarry-looking stools  For urgent or emergent issues, a gastroenterologist can be reached at any hour by calling [phone]. Do not use MyChart messaging for urgent concerns.    DIET:  We do recommend a small meal at first, but then you may proceed to your  regular diet.  Drink plenty of fluids but you should avoid alcoholic beverages for 24 hours.  ACTIVITY:  You should plan to take it easy for the rest of today and you should NOT DRIVE or use heavy machinery until tomorrow (because of the sedation medicines used during the test).    FOLLOW UP: Our staff will call the number listed on your records the next business day following your procedure.  We will call around 7:15- 8:00 am to check on you and address any questions or concerns that you may have regarding the information given to you following your procedure. If we do not reach you, we will leave a message.     If any biopsies were taken you will be contacted by phone or by letter within the next 1-3 weeks.  Please call us at [phone] if you have not heard about the biopsies in 3 weeks.    SIGNATURES/CONFIDENTIALITY: You and/or your care partner have signed paperwork which will be entered into your electronic medical record.  These signatures attest to the fact that that the information above on your After Visit Summary has been reviewed and is understood.  Full responsibility of the confidentiality of this discharge information lies with you and/or your care-partner.

## 2022-09-10 NOTE — Progress Notes (Signed)
See 08/28/2022 H&P, no changes

## 2022-09-11 ENCOUNTER — Telehealth: Payer: Self-pay

## 2022-09-11 NOTE — Telephone Encounter (Signed)
  Follow up Call-     09/10/2022    2:07 PM  Call back number  Post procedure Call Back phone  # [phone]  Permission to leave phone message Yes     Patient questions:  Do you have a fever, pain , or abdominal swelling? No. Pain Score  0 *  Have you tolerated food without any problems? Yes.    Have you been able to return to your normal activities? Yes.    Do you have any questions about your discharge instructions: Diet   No. Medications  No. Follow up visit  No.  Do you have questions or concerns about your Care? No.  Actions: * If pain score is 4 or above: No action needed, pain <4.

## 2022-09-13 DIAGNOSIS — C61 Malignant neoplasm of prostate: Secondary | ICD-10-CM | POA: Diagnosis not present

## 2022-09-17 ENCOUNTER — Encounter (HOSPITAL_COMMUNITY)
Admission: RE | Admit: 2022-09-17 | Discharge: 2022-09-17 | Disposition: A | Discharge status: Home / Self Care | Payer: Medicare PPO | Source: Ambulatory Visit | Attending: Urology | Admitting: Urology

## 2022-09-17 ENCOUNTER — Encounter (HOSPITAL_COMMUNITY): Payer: Self-pay | Admitting: Diagnostic Radiology

## 2022-09-17 DIAGNOSIS — C61 Malignant neoplasm of prostate: Secondary | ICD-10-CM | POA: Diagnosis not present

## 2022-09-17 DIAGNOSIS — K5730 Diverticulosis of large intestine without perforation or abscess without bleeding: Secondary | ICD-10-CM | POA: Diagnosis not present

## 2022-09-17 DIAGNOSIS — K8020 Calculus of gallbladder without cholecystitis without obstruction: Secondary | ICD-10-CM | POA: Diagnosis not present

## 2022-09-17 MED ORDER — TECHNETIUM TC 99M MEDRONATE IV KIT
20.0000 | PACK | Freq: Once | INTRAVENOUS | Status: AC | PRN
  Administered 2022-09-17: 19 via INTRAVENOUS

## 2022-09-20 DIAGNOSIS — M818 Other osteoporosis without current pathological fracture: Secondary | ICD-10-CM | POA: Diagnosis not present

## 2022-09-20 DIAGNOSIS — C61 Malignant neoplasm of prostate: Secondary | ICD-10-CM | POA: Diagnosis not present

## 2022-10-01 ENCOUNTER — Encounter: Payer: Self-pay | Admitting: Gastroenterology

## 2022-10-15 NOTE — Progress Notes (Signed)
GU Location of Tumor / Histology: Prostate Ca (recurrent), If Prostate Cancer, PSA is (2.94 on 06/2022)     Biopsies            09/17/2022 Dr. Tresa Moore NM Bone Scan Whole Body CLINICAL DATA:  Prostate cancer  COMPARISON:  07/26/2019  FINDINGS: Mild focal uptake within the right third rib anteriorly is nonspecific, possibly posttraumatic in nature. Previously noted uptake within the periodontal region and left seventh rib laterally have resolved. No focal uptake of radiotracer identified to suggest osseous metastatic disease. Degenerative changes noted within the shoulders. Normal soft tissue distribution. Normal uptake and excretion within the kidneys and bladder.   IMPRESSION: No evidence of osseous metastatic disease.  Nonspecific uptake within the right third rib laterally, possibly posttraumatic in nature.    07/26/2019 Dr. Tresa Moore NM Bone Scan Whole Body CLINICAL DATA:  Prostate cancer   FINDINGS: Focal uptake of radiotracer involving the left seventh rib laterally is nonspecific, but may be traumatic in nature. Focal uptake within the mandible likely relates to underlying periodontal disease. No definite evidence of metastatic disease within the visualized axial and appendicular skeleton. Normal soft tissue distribution. Normal uptake and excretion within the kidneys and bladder.   IMPRESSION: No definite osseous metastatic disease.  Focal uptake within the left seventh rib laterally may be posttraumatic in nature. This could be confirmed with dedicated radiographs of the left ribs if desired. Alternatively, this could be simply reassessed on subsequent surveillance examination.   Past/Anticipated interventions by urology, if any:  NA  Past/Anticipated interventions by medical oncology, if any:  NA  Weight changes, if any:   IPSS:  Bowel/Bladder complaints, if any: {:18581}   Nausea/Vomiting, if any: {:18581}  Pain issues, if any:  {:18581}  SAFETY  ISSUES: Prior radiation? {:18581} Pacemaker/ICD? {:18581} Possible current pregnancy? Male Is the patient on methotrexate? No  Current Complaints / other details:  ***

## 2022-10-15 NOTE — Progress Notes (Signed)
Radiation Oncology         [phone] ________________________________  Initial Outpatient Consultation  Name: [name] MRN: [id number]  Date: 10/16/2022  DOB: February 01, 1946  QM:VHQIONG, Hinton Dyer, DO  Alexis Frock, MD   REFERRING PHYSICIAN: Alexis Frock, MD  DIAGNOSIS: 76 y.o. gentleman with a rising PSA of 3.8 on total androgen blockade with ADT and Xtandi, s/p cryotherapy x2 for Gleason 4+4 prostate cancer.    ICD-10-CM   1. Malignant neoplasm of prostate ([place])  C61       HISTORY OF PRESENT ILLNESS: [name] is a 76 y.o. deaf male with a diagnosis of prostate cancer. He was initially diagnosed with Gleason 4+3 adenocarcinoma of the prostate in 2006 with PSA 8.38 and was treated with primary cryotherapy on 04/26/2005 under the care of Dr. Janice Norrie. A repeat TRUSPBx was performed 02/28/07 and confirmed disease progression with Gleason 4+4 in 12 of 12 cores. He underwent repeat cryoablation on 05/28/2007.  His PSA continued to gradually rise so he was enrolled in the STRIVE study with continuous Lupron and enzalutamide from 2014 - 2021. Restaging imaging continued to show no evidence of metastatic disease.   He has continued on ADT and enzalutamide under the care of Dr. Tresa Moore (though the study ended in 2021) who assumed his care after Dr. Janice Norrie retired. His PSA has fluctuated but remained overall stable, under 2 until this year. Over the last year, his PSA has continued to increase, despite total androgen blockade, and reached 3.8 by 09/13/2022. This prompted restaging CT A/P on 09/17/22 which showed  enhancing soft tissue in the superior prostate gland and right seminal vesicle but no definite lymphadenopathy or signs of metastatic disease elsewhere. A bone scan performed the same day was negative for evidence of osseous metastatic disease.  The patient reviewed the scan results with his urologist and he has kindly been referred today for discussion of potential definitive radiation  treatment options. He is accompanied by a medical ASL interpreter.   PREVIOUS RADIATION THERAPY: No  PAST MEDICAL HISTORY:  Past Medical History:  Diagnosis Date   Barrett's esophagus    Deafness    Diabetes mellitus, stable ([place])    prediabetic   GERD (gastroesophageal reflux disease)    Hiatal hernia    Hyperlipidemia    Hypertension    Iron deficiency anemia    Prostate cancer ([place])    prostate Ca 2006 and 2008      PAST SURGICAL HISTORY: Past Surgical History:  Procedure Laterality Date   COLONOSCOPY     ORIF WRIST FRACTURE Left 12/26/2013   Procedure: OPEN REDUCTION INTERNAL FIXATION (ORIF) WRIST FRACTURE;  Surgeon: Linna Hoff, MD;  Location: [facility];  Service: Orthopedics;  Laterality: Left;   PROSTATE SURGERY  2006 and 2008   x 2    FAMILY HISTORY:  Family History  Problem Relation Age of Onset   Diabetes Maternal Grandfather    Lung cancer Father     SOCIAL HISTORY:  Social History   Socioeconomic History   Marital status: Married    Spouse name: Not on file   Number of children: 3   Years of education: Not on file   Highest education level: Not on file  Occupational History   Occupation: disabled  Tobacco Use   Smoking status: Former    Types: Pipe    Quit date: 11/05/2005    Years since quitting: 16.9   Smokeless tobacco: Never  Vaping Use   Vaping Use: Never  used  Substance and Sexual Activity   Alcohol use: Yes    Comment: occ social drink-wine   Drug use: No   Sexual activity: Not on file  Other Topics Concern   Not on file  Social History Narrative   Not on file   Social Determinants of Health   Financial Resource Strain: Not on file  Food Insecurity: Not on file  Transportation Needs: Not on file  Physical Activity: Not on file  Stress: Not on file  Social Connections: Not on file  Intimate Partner Violence: Not on file    ALLERGIES: Patient has no known allergies.  MEDICATIONS:  Current Outpatient Medications  Medication  Sig Dispense Refill   AMLODIPINE BESYLATE PO Take 2 mg by mouth daily in the afternoon.     atorvastatin (LIPITOR) 40 MG tablet Take 20 mg by mouth daily at 6 PM.      Calcium Carbonate (CALCIUM 600 PO) Take 600 mg by mouth as directed. Monday Wednesday Friday at noon     Cholecalciferol (VITAMIN D3) 125 MCG (5000 UT) TABS Take 5,000 Units by mouth daily before breakfast.     Chromium Picolinate 500 MCG TABS Take 1 tablet by mouth daily in the afternoon.     enzalutamide (XTANDI) 40 MG capsule Take 160 mg by mouth daily before breakfast.     IRON, FERROUS SULFATE, PO Take 25 mg by mouth as directed. Monday Wednesday Friday  Energizing Iron     Leuprolide Acetate, 6 Month, (LUPRON) 45 MG injection Inject 45 mg into the muscle every 6 (six) months.     losartan (COZAAR) 100 MG tablet Take 100 mg by mouth daily.      metFORMIN (GLUCOPHAGE-XR) 500 MG 24 hr tablet 500 mg 2 (two) times daily.     Omega-3 Fatty Acids (FISH OIL) 1200 MG CAPS Take 1,200 mg by mouth daily.     omeprazole (PRILOSEC) 20 MG capsule Take 20 mg by mouth daily.     No current facility-administered medications for this encounter.    REVIEW OF SYSTEMS:  On review of systems, the patient reports that he is doing well overall. He denies any chest pain, shortness of breath, cough, fevers, chills, night sweats, unintended weight changes. He denies any bowel disturbances, and denies abdominal pain, nausea or vomiting. He denies any new musculoskeletal or joint aches or pains. His IPSS was 1, indicating minimal urinary symptoms. A complete review of systems is obtained and is otherwise negative.    PHYSICAL EXAM:  Wt Readings from Last 3 Encounters:  10/16/22 149 lb 6.4 oz (67.8 kg)  09/10/22 152 lb (68.9 kg)  08/28/22 152 lb 8 oz (69.2 kg)   Temp Readings from Last 3 Encounters:  10/16/22 97.8 F (36.6 C)  09/10/22 (!) 97.1 F (36.2 C)  05/07/19 98.7 F (37.1 C)   BP Readings from Last 3 Encounters:  10/16/22 (!) 171/80   09/10/22 132/63  08/28/22 128/74   Pulse Readings from Last 3 Encounters:  10/16/22 89  09/10/22 63  08/28/22 64   Pain Assessment Pain Score: 0-No pain/10  In general this is a well appearing Caucasian male in no acute distress. He's alert and oriented x4 and appropriate throughout the examination. Cardiopulmonary assessment is negative for acute distress, and he exhibits normal effort.     KPS = 100  100 - Normal; no complaints; no evidence of disease. 90   - Able to carry on normal activity; minor signs or symptoms of disease. 80   -  Normal activity with effort; some signs or symptoms of disease. 65   - Cares for self; unable to carry on normal activity or to do active work. 60   - Requires occasional assistance, but is able to care for most of his personal needs. 50   - Requires considerable assistance and frequent medical care. 27   - Disabled; requires special care and assistance. 75   - Severely disabled; hospital admission is indicated although death not imminent. 12   - Very sick; hospital admission necessary; active supportive treatment necessary. 10   - Moribund; fatal processes progressing rapidly. 0     - Dead  Karnofsky DA, Abelmann [place], Craver LS and Burchenal [facility] [phone]) The use of the nitrogen mustards in the palliative treatment of carcinoma: with particular reference to bronchogenic carcinoma Cancer 1 634-56  LABORATORY DATA:  Lab Results  Component Value Date   WBC 8.8 12/26/2013   HGB 10.9 (L) 12/26/2013   HCT 33.3 (L) 12/26/2013   MCV 84.3 12/26/2013   PLT 190 12/26/2013   Lab Results  Component Value Date   NA 138 12/26/2013   K 4.0 12/26/2013   CL 101 12/26/2013   CO2 24 12/26/2013   Lab Results  Component Value Date   ALT 35 05/28/2007   AST 34 05/28/2007   ALKPHOS 73 05/28/2007   BILITOT 1.2 05/28/2007     RADIOGRAPHY: NM Bone Scan Whole Body  Result Date: 09/19/2022 CLINICAL DATA:  Prostate cancer EXAM: NUCLEAR MEDICINE WHOLE BODY  BONE SCAN TECHNIQUE: Whole body anterior and posterior images were obtained approximately 3 hours after intravenous injection of radiopharmaceutical. RADIOPHARMACEUTICALS:  19.0 mCi Technetium-41mMDP IV COMPARISON:  07/26/2019 FINDINGS: Mild focal uptake within the right third rib anteriorly is nonspecific, possibly posttraumatic in nature. Previously noted uptake within the periodontal region and left seventh rib laterally have resolved. No focal uptake of radiotracer identified to suggest osseous metastatic disease. Degenerative changes noted within the shoulders. Normal soft tissue distribution. Normal uptake and excretion within the kidneys and bladder. IMPRESSION: No evidence of osseous metastatic disease. Nonspecific uptake within the right third rib laterally, possibly posttraumatic in nature. Electronically Signed   By: AFidela SalisburyM.D.   On: 09/19/2022 03:13      IMPRESSION/PLAN: 1. 76y.o. gentleman with a rising PSA of 3.8 on ADT and Xtandi, s/p cryotherapy x2 for Gleason 4+4 prostate cancer. We discussed the patient's workup and outlined the nature of prostate cancer in this setting. The patient's T stage, Gleason's score, and PSA put him into the high risk group. Accordingly, he is eligible for a variety of potential treatment options including LT-ADT in combination with 8 weeks of external radiation or prostatectomy. We discussed the available radiation techniques, and focused on the details and logistics of delivery. The patient is not a candidate for brachytherapy boost given his very small prostate volume s/p cryoablation x2 previously. We discussed and outlined the risks, benefits, short and long-term effects associated with radiotherapy and compared and contrasted these with prostatectomy. Since he has had prior cryotherapy resulting in tissue fibrosis, SpaceOAR gel is contraindicated. He has been on total androgen blockade with Eligard q 6 months and Xtandi daily for many years now and  continues to tolerate this well. His last Eligard injection was given in 06/2022.  He was encouraged to ask questions that were answered to his stated satisfaction.  At the conclusion of our conversation, the patient is interested in moving forward with the recommended 8 week course  of daily external beam radiation concurrent with his current total androgen blockade. We will contact [facility] to make arrangements for fiducial marker placement in Jan. 2024, prior to simulation. We will share our discussion with Dr. Tresa Moore and move forward with scheduling placement of 3 gold fiducial markers into the prostate to proceed with IMRT in Jan. 2024, per patient preference to start treatment after the first of the year. He does prefer to complete his treatment prior to April 2024 as to not interfere with a planned family vacation. We enjoyed meeting him today and look forward to continuing to participate in his care.  We personally spent 70 minutes in this encounter including chart review, reviewing radiological studies, meeting face-to-face with the patient, entering orders and completing documentation.    Nicholos Johns, PA-C    Tyler Pita, MD  Secretary Oncology Direct Dial: [phone]  Fax: [phone] [email or web address]  Skype  [email or web address]   This document serves as a record of services personally performed by Tyler Pita, MD and Freeman Caldron, PA-C. It was created on their behalf by Wilburn Mylar, a trained medical scribe. The creation of this record is based on the scribe's personal observations and the provider's statements to them. This document has been checked and approved by the attending provider.

## 2022-10-16 ENCOUNTER — Ambulatory Visit
Admission: RE | Admit: 2022-10-16 | Discharge: 2022-10-16 | Disposition: A | Discharge status: Home / Self Care | Payer: Medicare PPO | Source: Ambulatory Visit | Attending: Radiation Oncology | Admitting: Radiation Oncology

## 2022-10-16 VITALS — BP 171/80 | HR 89 | Temp 97.8°F | Resp 20 | Ht 64.0 in | Wt 149.4 lb

## 2022-10-16 DIAGNOSIS — Z79899 Other long term (current) drug therapy: Secondary | ICD-10-CM | POA: Diagnosis not present

## 2022-10-16 DIAGNOSIS — Z192 Hormone resistant malignancy status: Secondary | ICD-10-CM | POA: Diagnosis not present

## 2022-10-16 DIAGNOSIS — C61 Malignant neoplasm of prostate: Secondary | ICD-10-CM | POA: Insufficient documentation

## 2022-10-16 DIAGNOSIS — Z801 Family history of malignant neoplasm of trachea, bronchus and lung: Secondary | ICD-10-CM | POA: Insufficient documentation

## 2022-10-16 DIAGNOSIS — K219 Gastro-esophageal reflux disease without esophagitis: Secondary | ICD-10-CM | POA: Insufficient documentation

## 2022-10-16 DIAGNOSIS — Z87891 Personal history of nicotine dependence: Secondary | ICD-10-CM | POA: Diagnosis not present

## 2022-10-16 DIAGNOSIS — E119 Type 2 diabetes mellitus without complications: Secondary | ICD-10-CM | POA: Insufficient documentation

## 2022-10-16 DIAGNOSIS — Z923 Personal history of irradiation: Secondary | ICD-10-CM | POA: Diagnosis not present

## 2022-10-16 DIAGNOSIS — Z7984 Long term (current) use of oral hypoglycemic drugs: Secondary | ICD-10-CM | POA: Insufficient documentation

## 2022-10-16 DIAGNOSIS — D509 Iron deficiency anemia, unspecified: Secondary | ICD-10-CM | POA: Insufficient documentation

## 2022-10-16 DIAGNOSIS — E785 Hyperlipidemia, unspecified: Secondary | ICD-10-CM | POA: Insufficient documentation

## 2022-10-16 DIAGNOSIS — Z8719 Personal history of other diseases of the digestive system: Secondary | ICD-10-CM | POA: Insufficient documentation

## 2022-10-16 DIAGNOSIS — I10 Essential (primary) hypertension: Secondary | ICD-10-CM | POA: Diagnosis not present

## 2022-10-23 ENCOUNTER — Telehealth: Payer: Self-pay | Admitting: *Deleted

## 2022-10-23 ENCOUNTER — Other Ambulatory Visit: Payer: Self-pay | Admitting: Urology

## 2022-10-23 NOTE — Telephone Encounter (Signed)
CALLED PATIENT TO INFORM OF FID. MARKERS AND SPACE OAR TO BE PLACED ON 11-21-22 AND HIS SIM APPT. ON 11-23-22- ARRIVAL TIME- 10:45 AM @ [facility], INFORMED PATIENT TO ARRIVE WITH A FULL BLADDER, RELAYED MESSAGE TO PATIENT AND THEY ARE AWARE OF THESE APPTS.

## 2022-11-20 ENCOUNTER — Other Ambulatory Visit: Payer: Self-pay

## 2022-11-20 ENCOUNTER — Encounter (HOSPITAL_BASED_OUTPATIENT_CLINIC_OR_DEPARTMENT_OTHER): Payer: Self-pay | Admitting: Urology

## 2022-11-20 NOTE — Progress Notes (Signed)
Spoke w/ via phone for pre-op interview---Ell via phone sign language interpreter Lab needs dos----Istat & ekg             Lab results------09/17/22 CT a/p COVID test -----patient states asymptomatic no test needed Arrive at -------1245 on Wednesday, 11/21/22 NPO after MN NO Solid Food.  Clear liquids from MN until---1145 Med rec completed Medications to take morning of surgery -----Amlodipine, Prilosec Diabetic medication -----Hold Metformin morning of surgery. Patient instructed no nail polish to be worn day of surgery Patient instructed to bring photo id and insurance card day of surgery Patient aware to have Driver (ride ) / caregiver    for 24 hours after surgery - wife, Suanne Marker Patient Special Instructions -----Do Fleet's enema night before surgery per surgeon instructions Pre-Op special Istructions -----Sign language interpreter requested for day of surgery. Patient verbalized understanding of instructions that were given at this phone interview. Patient denies shortness of breath, chest pain, fever, cough at this phone interview.

## 2022-11-21 ENCOUNTER — Other Ambulatory Visit: Payer: Self-pay

## 2022-11-21 ENCOUNTER — Encounter (HOSPITAL_BASED_OUTPATIENT_CLINIC_OR_DEPARTMENT_OTHER)
Admission: RE | Disposition: A | Discharge status: Home / Self Care | Payer: Self-pay | Source: Ambulatory Visit | Attending: Urology

## 2022-11-21 ENCOUNTER — Ambulatory Visit (HOSPITAL_BASED_OUTPATIENT_CLINIC_OR_DEPARTMENT_OTHER): Payer: Medicare PPO | Admitting: Anesthesiology

## 2022-11-21 ENCOUNTER — Ambulatory Visit (HOSPITAL_BASED_OUTPATIENT_CLINIC_OR_DEPARTMENT_OTHER)
Admission: RE | Admit: 2022-11-21 | Discharge: 2022-11-21 | Disposition: A | Discharge status: Home / Self Care | Payer: Medicare PPO | Source: Ambulatory Visit | Attending: Urology | Admitting: Urology

## 2022-11-21 ENCOUNTER — Encounter (HOSPITAL_BASED_OUTPATIENT_CLINIC_OR_DEPARTMENT_OTHER): Payer: Self-pay | Admitting: Urology

## 2022-11-21 DIAGNOSIS — H9190 Unspecified hearing loss, unspecified ear: Secondary | ICD-10-CM | POA: Insufficient documentation

## 2022-11-21 DIAGNOSIS — I10 Essential (primary) hypertension: Secondary | ICD-10-CM

## 2022-11-21 DIAGNOSIS — C61 Malignant neoplasm of prostate: Secondary | ICD-10-CM | POA: Insufficient documentation

## 2022-11-21 DIAGNOSIS — E119 Type 2 diabetes mellitus without complications: Secondary | ICD-10-CM

## 2022-11-21 DIAGNOSIS — Z01818 Encounter for other preprocedural examination: Secondary | ICD-10-CM

## 2022-11-21 DIAGNOSIS — Z8719 Personal history of other diseases of the digestive system: Secondary | ICD-10-CM | POA: Insufficient documentation

## 2022-11-21 DIAGNOSIS — K219 Gastro-esophageal reflux disease without esophagitis: Secondary | ICD-10-CM | POA: Insufficient documentation

## 2022-11-21 DIAGNOSIS — Z7984 Long term (current) use of oral hypoglycemic drugs: Secondary | ICD-10-CM

## 2022-11-21 DIAGNOSIS — Z87891 Personal history of nicotine dependence: Secondary | ICD-10-CM

## 2022-11-21 DIAGNOSIS — K449 Diaphragmatic hernia without obstruction or gangrene: Secondary | ICD-10-CM | POA: Diagnosis not present

## 2022-11-21 HISTORY — PX: GOLD SEED IMPLANT: SHX6343

## 2022-11-21 HISTORY — PX: SPACE OAR INSTILLATION: SHX6769

## 2022-11-21 LAB — POCT I-STAT, CHEM 8
BUN: 6 mg/dL — ABNORMAL LOW (ref 8–23)
Calcium, Ion: 1.3 mmol/L (ref 1.15–1.40)
Chloride: 104 mmol/L (ref 98–111)
Creatinine, Ser: 0.8 mg/dL (ref 0.61–1.24)
Glucose, Bld: 128 mg/dL — ABNORMAL HIGH (ref 70–99)
HCT: 36 % — ABNORMAL LOW (ref 39.0–52.0)
Hemoglobin: 12.2 g/dL — ABNORMAL LOW (ref 13.0–17.0)
Potassium: 4 mmol/L (ref 3.5–5.1)
Sodium: 139 mmol/L (ref 135–145)
TCO2: 24 mmol/L (ref 22–32)

## 2022-11-21 SURGERY — INSERTION, GOLD SEEDS
Anesthesia: Monitor Anesthesia Care | Site: Prostate

## 2022-11-21 MED ORDER — LIDOCAINE HCL (PF) 2 % IJ SOLN
INTRAMUSCULAR | Status: DC | PRN
  Administered 2022-11-21: 10 mL

## 2022-11-21 MED ORDER — PROPOFOL 10 MG/ML IV BOLUS
INTRAVENOUS | Status: DC | PRN
  Administered 2022-11-21: 20 mg via INTRAVENOUS
  Administered 2022-11-21 (×2): 10 mg via INTRAVENOUS

## 2022-11-21 MED ORDER — CEFAZOLIN SODIUM-DEXTROSE 2-4 GM/100ML-% IV SOLN
2.0000 g | INTRAVENOUS | Status: AC
  Administered 2022-11-21: 2 g via INTRAVENOUS

## 2022-11-21 MED ORDER — ONDANSETRON HCL 4 MG/2ML IJ SOLN: 4.0000 mg | Freq: Once | INTRAMUSCULAR | Status: DC | PRN

## 2022-11-21 MED ORDER — PROPOFOL 10 MG/ML IV BOLUS
INTRAVENOUS | Status: AC
  Filled 2022-11-21: qty 20

## 2022-11-21 MED ORDER — LIDOCAINE HCL (CARDIAC) PF 100 MG/5ML IV SOSY
PREFILLED_SYRINGE | INTRAVENOUS | Status: DC | PRN
  Administered 2022-11-21: 40 mg via INTRAVENOUS

## 2022-11-21 MED ORDER — PHENYLEPHRINE 80 MCG/ML (10ML) SYRINGE FOR IV PUSH (FOR BLOOD PRESSURE SUPPORT)
PREFILLED_SYRINGE | INTRAVENOUS | Status: AC
  Filled 2022-11-21: qty 10

## 2022-11-21 MED ORDER — ONDANSETRON HCL 4 MG/2ML IJ SOLN
INTRAMUSCULAR | Status: DC | PRN
  Administered 2022-11-21: 4 mg via INTRAVENOUS

## 2022-11-21 MED ORDER — LACTATED RINGERS IV SOLN: INTRAVENOUS | Status: DC

## 2022-11-21 MED ORDER — FENTANYL CITRATE (PF) 100 MCG/2ML IJ SOLN
INTRAMUSCULAR | Status: DC | PRN
  Administered 2022-11-21 (×2): 25 ug via INTRAVENOUS

## 2022-11-21 MED ORDER — ACETAMINOPHEN 500 MG PO TABS
ORAL_TABLET | ORAL | Status: AC
  Filled 2022-11-21: qty 2

## 2022-11-21 MED ORDER — ACETAMINOPHEN 500 MG PO TABS
1000.0000 mg | ORAL_TABLET | Freq: Once | ORAL | Status: AC
  Administered 2022-11-21: 1000 mg via ORAL

## 2022-11-21 MED ORDER — FENTANYL CITRATE (PF) 100 MCG/2ML IJ SOLN
INTRAMUSCULAR | Status: AC
  Filled 2022-11-21: qty 2

## 2022-11-21 MED ORDER — FLEET ENEMA 7-19 GM/118ML RE ENEM: 1.0000 | ENEMA | Freq: Once | RECTAL | Status: DC

## 2022-11-21 MED ORDER — FENTANYL CITRATE (PF) 100 MCG/2ML IJ SOLN: 25.0000 ug | INTRAMUSCULAR | Status: DC | PRN

## 2022-11-21 MED ORDER — PROPOFOL 500 MG/50ML IV EMUL
INTRAVENOUS | Status: DC | PRN
  Administered 2022-11-21: 140 ug/kg/min via INTRAVENOUS

## 2022-11-21 MED ORDER — LIDOCAINE HCL (PF) 2 % IJ SOLN
INTRAMUSCULAR | Status: AC
  Filled 2022-11-21: qty 5

## 2022-11-21 MED ORDER — TRAMADOL HCL 50 MG PO TABS: 50.0000 mg | ORAL_TABLET | Freq: Four times a day (QID) | ORAL | 0 refills | Status: DC | PRN

## 2022-11-21 MED ORDER — PROPOFOL 1000 MG/100ML IV EMUL
INTRAVENOUS | Status: AC
  Filled 2022-11-21: qty 100

## 2022-11-21 MED ORDER — CEFAZOLIN SODIUM-DEXTROSE 2-4 GM/100ML-% IV SOLN
INTRAVENOUS | Status: AC
  Filled 2022-11-21: qty 100

## 2022-11-21 SURGICAL SUPPLY — 26 items
BLADE CLIPPER SENSICLIP SURGIC (BLADE) ×1 IMPLANT
CNTNR URN SCR LID CUP LEK RST (MISCELLANEOUS) ×1 IMPLANT
CONT SPEC 4OZ STRL OR WHT (MISCELLANEOUS) ×1
COVER BACK TABLE 60X90IN (DRAPES) ×1 IMPLANT
DRSG TEGADERM 4X4.75 (GAUZE/BANDAGES/DRESSINGS) ×1 IMPLANT
DRSG TEGADERM 8X12 (GAUZE/BANDAGES/DRESSINGS) ×1 IMPLANT
GAUZE SPONGE 4X4 12PLY STRL (GAUZE/BANDAGES/DRESSINGS) ×1 IMPLANT
GLOVE BIO SURGEON STRL SZ7.5 (GLOVE) ×1 IMPLANT
GLOVE BIOGEL PI IND STRL 6 (GLOVE) IMPLANT
GLOVE SURG ORTHO 8.5 STRL (GLOVE) ×1 IMPLANT
GOWN STRL REUS W/TWL LRG LVL3 (GOWN DISPOSABLE) IMPLANT
IMPL SPACEOAR VUE SYSTEM (Spacer) ×1 IMPLANT
IMPLANT SPACEOAR VUE SYSTEM (Spacer) IMPLANT
KIT TURNOVER CYSTO (KITS) ×1 IMPLANT
MARKER GOLD PRELOAD 1.2X3 (Urological Implant) ×1 IMPLANT
MARKER SKIN DUAL TIP RULER LAB (MISCELLANEOUS) ×1 IMPLANT
NDL SPNL 22GX3.5 QUINCKE BK (NEEDLE) ×1 IMPLANT
NEEDLE SPNL 22GX3.5 QUINCKE BK (NEEDLE) ×1 IMPLANT
SEED GOLD PRELOAD 1.2X3 (Urological Implant) ×1 IMPLANT
SHEATH ULTRASOUND LF (SHEATH) IMPLANT
SHEATH ULTRASOUND LTX NONSTRL (SHEATH) IMPLANT
SURGILUBE 2OZ TUBE FLIPTOP (MISCELLANEOUS) ×1 IMPLANT
SYR 10ML LL (SYRINGE) IMPLANT
SYR CONTROL 10ML LL (SYRINGE) ×1 IMPLANT
TOWEL OR 17X26 10 PK STRL BLUE (TOWEL DISPOSABLE) ×1 IMPLANT
UNDERPAD 30X36 HEAVY ABSORB (UNDERPADS AND DIAPERS) ×1 IMPLANT

## 2022-11-21 NOTE — Op Note (Signed)
[name], DONAHOE MEDICAL RECORD NO: [id number] ACCOUNT NO: [id number] DATE OF BIRTH: 29-Nov-1945 FACILITY: [facility] LOCATION: [facility] PHYSICIAN: Alexis Frock, MD  Operative Report   DATE OF PROCEDURE: 11/21/2022  PREOPERATIVE DIAGNOSIS:  Locally recurrent prostate cancer.  PROCEDURE:  Transrectal ultrasound fiducial marker placement.  ESTIMATED BLOOD LOSS:  Nil.  COMPLICATIONS:  None.  SPECIMEN:  None.  FINDINGS:  1.  Very difficult to visualize the prostate due to prior cryotherapy. 2.  Successful placement of fiducial markers in the presumed right mid base, right mid apex, left far lateral in the prostate. 3.  Inability to convincingly and safely verify anterior prerectal space.  Therefore, SpaceOAR not delivered.  INDICATIONS:  [name] is a very pleasant 77 year old man with longstanding history of adenocarcinoma of the prostate.  He is status post prior ablation by another provider many years ago as well as a repeat cryoablation years ago.  He has also been in a  clinical trial with steroid synthesis inhibitor for quite some time.  He has been found to have a rising PSA most recently.  Staging imaging revealed no distant disease, but some enhancement of his seminal vesicle area likely consistent with local disease.  Options  were discussed for management including third line systemic medical therapy versus local therapy with attempt at salvage radiation.  He wished proceed with the later.  He presents today for fiducial marker placement as a part of his salvage radiation.   Informed consent was obtained and placed in medical record.  PROCEDURE IN DETAIL:  The patient being [name] verified and the procedure being prostate fiducial marker placement under ultrasound was confirmed.  Procedure timeout was performed.  Intravenous antibiotics were administered.  Monitored anesthesia  care.  Conscious sedation was placed.  The patient was placed into medium lithotomy position.   His scrotum was tucked out of the operative field using extra-large Tegaderm.  Transrectal ultrasound was then placed.  Transrectal ultrasound revealed a  paucity of prostate tissue and a significant prostatic fossa defect consistent with known prior cryoablation.  There was what appeared to be a bit of median lobe tissue in the midline.  This was very difficult to discern.  Under transrectal ultrasound  guidance, fiducial markers were placed at the right mid base, right mid apex, left far lateral areas and what appeared to be residual prostate tissue.  Using transrectal ultrasound guidance, I tried to eventually delineate the anterior rectal plane as  noted by the anterior rectal fat stripe.  This was quite difficult to discern likely owing to his prior cryoablation.  He appeared to have a hump of tissue of his rectum encroaching and essentially direct apposition to his posterior prostate capsule  without convincing fat stripe between these structures.  Attention was then turned to place the spacer and the SpaceOAR finder needle into the presumed anterior prerectal space.  However, I could not convincingly identify this plane to the point where I  felt comfortable to place the SpaceOAR gel.  Therefore, it was not administered.  Procedure was terminated.  The patient tolerated the procedure well, no immediate perioperative complications.  The patient taken to the postanesthesia care in stable  condition.   Plan for discharge home.   MUK D: 11/21/2022 3:02:01 pm T: 11/21/2022 11:25:00 pm  JOB: [id number]/ [id number]

## 2022-11-21 NOTE — Brief Op Note (Signed)
11/21/2022  2:56 PM  PATIENT:  [name]  77 y.o. male  PRE-OPERATIVE DIAGNOSIS:  PROSTATE CANCER  POST-OPERATIVE DIAGNOSIS:  PROSTATE CANCER  PROCEDURE:  Prostate Fiducial Marker Placement  SURGEON:  Surgeon(s) and Role:    * Alexis Frock, MD - Primary  PHYSICIAN ASSISTANT:   ASSISTANTS: none   ANESTHESIA:   MAC  EBL:  minimal   BLOOD ADMINISTERED:none  DRAINS: none   LOCAL MEDICATIONS USED:  NONE  SPECIMEN:  No Specimen  DISPOSITION OF SPECIMEN:  N/A  COUNTS:  YES  TOURNIQUET:  * No tourniquets in log *  DICTATION: .Other Dictation: Dictation Number [id number]  PLAN OF CARE: Discharge to home after PACU  PATIENT DISPOSITION:  PACU - hemodynamically stable.   Delay start of Pharmacological VTE agent (>24hrs) due to surgical blood loss or risk of bleeding: yes

## 2022-11-21 NOTE — Transfer of Care (Signed)
Immediate Anesthesia Transfer of Care Note  Patient: [name]  Procedure(s) Performed: GOLD SEED IMPLANT (Prostate) ABORTED SPACE OAR INSTILLATION (Prostate)  Patient Location: PACU  Anesthesia Type:MAC  Level of Consciousness: drowsy and patient cooperative  Airway & Oxygen Therapy: Patient Spontanous Breathing and Patient connected to face mask oxygen  Post-op Assessment: Report given to RN and Post -op Vital signs reviewed and stable  Post vital signs: Reviewed and stable  Last Vitals:  Vitals Value Taken Time  BP 126/55 11/21/22 1504  Temp    Pulse 60 11/21/22 1507  Resp 11 11/21/22 1507  SpO2 100 % 11/21/22 1507  Vitals shown include unvalidated device data.  Last Pain:  Vitals:   11/21/22 1336  TempSrc: Oral  PainSc: 0-No pain      Patients Stated Pain Goal: 6 (83/29/19 1660)  Complications: No notable events documented.

## 2022-11-21 NOTE — Discharge Instructions (Addendum)
1 - You may have urinary urgency (bladder spasms) and bloody urine on / off for few days. This is normal.  2 - Call MD or go to ER for fever >102, severe pain / nausea / vomiting not relieved by medications, or acute change in medical status  No tylenol until after 7:45 p.m.   Post Anesthesia Home Care Instructions  Activity: Get plenty of rest for the remainder of the day. A responsible individual must stay with you for 24 hours following the procedure.  For the next 24 hours, DO NOT: -Drive a car -Paediatric nurse -Drink alcoholic beverages -Take any medication unless instructed by your physician -Make any legal decisions or sign important papers.  Meals: Start with liquid foods such as gelatin or soup. Progress to regular foods as tolerated. Avoid greasy, spicy, heavy foods. If nausea and/or vomiting occur, drink only clear liquids until the nausea and/or vomiting subsides. Call your physician if vomiting continues.  Special Instructions/Symptoms: Your throat may feel dry or sore from the anesthesia or the breathing tube placed in your throat during surgery. If this causes discomfort, gargle with warm salt water. The discomfort should disappear within 24 hours.

## 2022-11-21 NOTE — Anesthesia Preprocedure Evaluation (Addendum)
Anesthesia Evaluation  Patient identified by MRN, date of birth, ID band Patient awake    Reviewed: Allergy & Precautions, NPO status , Patient's Chart, lab work & pertinent test results  Airway Mallampati: II  TM Distance: >3 FB Neck ROM: Full    Dental  (+) Teeth Intact, Dental Advisory Given   Pulmonary former smoker   Pulmonary exam normal breath sounds clear to auscultation       Cardiovascular hypertension, Pt. on medications Normal cardiovascular exam Rhythm:Regular Rate:Normal     Neuro/Psych Deaf negative neurological ROS     GI/Hepatic Neg liver ROS, hiatal hernia,GERD  Medicated,,  Endo/Other  diabetes, Type 2, Oral Hypoglycemic Agents    Renal/GU negative Renal ROS   Prostate cancer     Musculoskeletal negative musculoskeletal ROS (+)    Abdominal   Peds  Hematology negative hematology ROS (+)   Anesthesia Other Findings Day of surgery medications reviewed with the patient.  Reproductive/Obstetrics                             Anesthesia Physical Anesthesia Plan  ASA: 2  Anesthesia Plan: MAC   Post-op Pain Management: Tylenol PO (pre-op)*   Induction: Intravenous  PONV Risk Score and Plan: 1 and TIVA, Treatment may vary due to age or medical condition, Dexamethasone and Ondansetron  Airway Management Planned: Natural Airway and Simple Face Mask  Additional Equipment:   Intra-op Plan:   Post-operative Plan:   Informed Consent: I have reviewed the patients History and Physical, chart, labs and discussed the procedure including the risks, benefits and alternatives for the proposed anesthesia with the patient or authorized representative who has indicated his/her understanding and acceptance.     Dental advisory given and Interpreter used for interveiw  Plan Discussed with: CRNA and Anesthesiologist  Anesthesia Plan Comments:         Anesthesia Quick  Evaluation

## 2022-11-21 NOTE — H&P (Signed)
[name] is an 77 y.o. male.    Chief Complaint: Pre-Op Prostate Fiducial Marker / SPACE-OAR Placement  HPI:   1 - Recurrent Prostate Cancer - s/p primary cryo 2006 and re-ablation 2008 by Dr. Janice Herring. Has been on Lupron continuous with enzalutamide (STRIVE study) since 2014.  Primary Therapy - s/p primary cryo 2006 and re-ablation 2008 by Dr. Janice Herring.  Prior Therapy - continuous androgen deprivation, enzalutamide (STRIVE study, ended 2021)  Recent Restating - 09/2022 - CT, BS - no metasatic disease, Rt SV enhancement (some progression)    Recent Course:  02/2021 PSA 1.3, T17, CMP Nl (on xtandi); 05/2021 PSA 1.47, T 15, CMP Nl ==> Eligard 45, continue Xtandi; 08/2021 PSA 1.78, T 15, CMP Nl ==> continue Xtandi  12/2021 PSA 1.75, T 11, CMP Nl ==> Eligard 45 + Prolia, continue XTandi + Prolia start; 03/2022 PSA 2.2, T80, CMP Nl, continue Xtandi  06/2022 PSA 2.94, T 14, CMP Nl ==> Eligard 45 + Prolia, continue XTandi; 09/2022 PSA 3.8, T 13, CMP Nl, continue Xtandi   2 - Bone Health / Osteoporosis -  Recent labs - VitD, Cr normal 2021  Recent DEXA -02/2021 T-3.9 (osteoporosis)  Dental Clearance - Received 09/2021 (had extraction prior)  Current therapy - Vit D, Ca + [id number]    PMH sig for ortho surgery, deafness. No CV disease / blood thinners. His PCP is Matthew GravelMD.    Today "[name]" is seen ito proceed with prostate fiducial marker and SPACE-OAR placement in preparation for local / salvage radation for prostate cancer. No interval fevers.   Past Medical History:  Diagnosis Date   Barrett's esophagus    per EGD on 09/10/22 in [facility]   Deafness    needs sign language interpreter   Diabetes mellitus, stable ([name]    Type 2 diabetes, 07/19/22 Hgb Ac 6.8   GERD (gastroesophageal reflux disease)    takes Prilosec daily   Hiatal hernia    per EGD on 09/10/22 in Epic, large   Hyperlipidemia    Follows w/ PCP, Dr. DJanie Herring   Hypertension    [place]s [facility]/ [email or web address] [place]   Iron deficiency anemia    no hx of iron or blood infusions as of 11/20/22, takes iron supplements   [place]    prostate Ca 2006 and 2008    Past Surgical History:  Procedure Laterality Date   COLONOSCOPY  2015   ESOPHAGOGASTRODUODENOSCOPY  09/10/2022   hx of multiple EGDs in the past also   ORIF WRIST FRACTURE Left 12/26/2013   Procedure: OPEN REDUCTION INTERNAL FIXATION (ORIF) WRIST FRACTURE;  Surgeon: FLinna Hoff MD;  [facility]  Service: Orthopedics;  Laterality: Left;   PROSTATE SURGERY  2006 and 2008   x 2    Family History  Problem Relation Age of Onset   Diabetes Maternal Grandfather    Lung cancer Father    Social History:  reports that he quit smoking about 17 years ago. His smoking use included pipe. He has never used smokeless tobacco. He reports current alcohol use. He reports that he does not use drugs.  Allergies: No Known Allergies  No medications prior to admission.    No results found for this or any previous visit (from the past 48 hour(s)). No results found.  Review of Systems  Constitutional:  Negative for chills and fever.  All other systems reviewed and are negative.  Height '5\' 5"'$  (1.651 m), weight 68 kg. Physical Exam Vitals reviewed.  Constitutional:      Comments: Very pleasant, at [place]. Stable deafness. Communication by notepad / typing (his preference).  HENT:     Head: Normocephalic.  Eyes:     Pupils: Pupils are equal, round, and reactive to light.  Cardiovascular:     Rate and Rhythm: Normal rate.  Pulmonary:     Effort: Pulmonary effort is normal.  Abdominal:     General: Abdomen is flat.  Musculoskeletal:        General: Normal range of motion.     Cervical back: Normal range of motion.  Neurological:     General: No focal deficit present.     Mental Status: He is alert.  Psychiatric:        Mood and Affect: Mood normal.       Assessment/Plan  Proceed as planned with prostate fiducial marker / SPACE-OAR. RIsks, benefits, alternatives, expected peri-op course discussed previously and reiterated today.   Matthew Frock, MD 11/21/2022, 5:55 AM

## 2022-11-21 NOTE — Progress Notes (Signed)
Interpreter at bedside.

## 2022-11-22 ENCOUNTER — Telehealth: Payer: Self-pay | Admitting: *Deleted

## 2022-11-22 NOTE — Telephone Encounter (Signed)
CALLED PATIENT TO REMIND OF SIM APPT. FOR 11-23-22- ARRIVAL TIME- 10:45 AM @ [facility], INFORMED PATIENT TO ARRIVE WITH A FULL BLADDER, PATIENT IS AWARE OF THIS APPT. AND THE INSTRUCTIONS

## 2022-11-22 NOTE — Progress Notes (Signed)
  Radiation Oncology         [phone] ________________________________  Name: [name] MRN: [id number]  Date: 11/23/2022  DOB: [birth year]  SIMULATION AND TREATMENT PLANNING NOTE    ICD-10-CM   1. Malignant neoplasm of prostate ([place])  C61       DIAGNOSIS:  77 y.o. gentleman with a rising PSA of 3.8 on total androgen blockade with ADT and Xtandi, s/p cryotherapy x2 for Gleason 4+4 prostate cancer.   NARRATIVE:  The patient was brought to the [facility].  Identity was confirmed.  All relevant records and images related to the planned course of therapy were reviewed.  The patient freely provided informed written consent to proceed with treatment after reviewing the details related to the planned course of therapy. The consent form was witnessed and verified by the simulation staff.  Then, the patient was set-up in a stable reproducible supine position for radiation therapy.  A vacuum lock pillow device was custom fabricated to position his legs in a reproducible immobilized position.  Then, I performed a urethrogram under sterile conditions to identify the prostatic apex.  CT images were obtained.  Surface markings were placed.  The CT images were loaded into the planning software.  Then the prostate target and avoidance structures including the rectum, bladder, bowel and hips were contoured.  Treatment planning then occurred.  The radiation prescription was entered and confirmed.  A total of one complex treatment devices was fabricated. I have requested : Intensity Modulated Radiotherapy (IMRT) is medically necessary for this case for the following reason:  Rectal sparing.Marland Kitchen  PLAN:   The prostate, seminal vesicles, and pelvic lymph nodes will initially be treated to 45 Gy in 25 fractions of 1.8 Gy followed by a boost to the prostate only, to 75 Gy with 15 additional fractions of 2.0 Gy   ________________________________  Sheral Apley Tammi Klippel, M.D.

## 2022-11-22 NOTE — Anesthesia Postprocedure Evaluation (Signed)
Anesthesia Post Note  Patient: [name]  Procedure(s) Performed: GOLD SEED IMPLANT (Prostate) ABORTED SPACE OAR INSTILLATION (Prostate)     Patient location during evaluation: PACU Anesthesia Type: MAC Level of consciousness: awake and alert Pain management: pain level controlled Vital Signs Assessment: post-procedure vital signs reviewed and stable Respiratory status: spontaneous breathing, nonlabored ventilation, respiratory function stable and patient connected to nasal cannula oxygen Cardiovascular status: stable and blood pressure returned to baseline Postop Assessment: no apparent nausea or vomiting Anesthetic complications: no   No notable events documented.  Last Vitals:  Vitals:   11/21/22 1546 11/21/22 1605  BP: (!) 145/60 (!) 141/68  Pulse: 63 65  Resp: (!) 21 16  Temp:  37 C  SpO2: 98% 99%    Last Pain:  Vitals:   11/21/22 1605  TempSrc:   PainSc: 0-No pain                 Santa Lighter

## 2022-11-23 ENCOUNTER — Encounter (HOSPITAL_BASED_OUTPATIENT_CLINIC_OR_DEPARTMENT_OTHER): Payer: Self-pay | Admitting: Urology

## 2022-11-23 ENCOUNTER — Ambulatory Visit
Admission: RE | Admit: 2022-11-23 | Discharge: 2022-11-23 | Disposition: A | Discharge status: Home / Self Care | Payer: Medicare PPO | Source: Ambulatory Visit | Attending: Radiation Oncology | Admitting: Radiation Oncology

## 2022-11-23 DIAGNOSIS — Z192 Hormone resistant malignancy status: Secondary | ICD-10-CM | POA: Diagnosis not present

## 2022-11-23 DIAGNOSIS — C61 Malignant neoplasm of prostate: Secondary | ICD-10-CM | POA: Diagnosis not present

## 2022-11-27 DIAGNOSIS — Z192 Hormone resistant malignancy status: Secondary | ICD-10-CM | POA: Diagnosis not present

## 2022-11-27 DIAGNOSIS — C61 Malignant neoplasm of prostate: Secondary | ICD-10-CM | POA: Diagnosis not present

## 2022-12-05 ENCOUNTER — Ambulatory Visit
Admission: RE | Admit: 2022-12-05 | Discharge: 2022-12-05 | Disposition: A | Discharge status: Home / Self Care | Payer: Medicare PPO | Source: Ambulatory Visit | Attending: Radiation Oncology | Admitting: Radiation Oncology

## 2022-12-05 ENCOUNTER — Other Ambulatory Visit: Payer: Self-pay

## 2022-12-05 DIAGNOSIS — Z192 Hormone resistant malignancy status: Secondary | ICD-10-CM | POA: Diagnosis not present

## 2022-12-05 DIAGNOSIS — Z510 Encounter for antineoplastic radiation therapy: Secondary | ICD-10-CM | POA: Diagnosis not present

## 2022-12-05 DIAGNOSIS — C61 Malignant neoplasm of prostate: Secondary | ICD-10-CM | POA: Diagnosis not present

## 2022-12-05 LAB — RAD ONC ARIA SESSION SUMMARY
Course Elapsed Days: 0
Plan Fractions Treated to Date: 1
Plan Prescribed Dose Per Fraction: 1.8 Gy
Plan Total Fractions Prescribed: 25
Plan Total Prescribed Dose: 45 Gy
Reference Point Dosage Given to Date: 1.8 Gy
Reference Point Session Dosage Given: 1.8 Gy
Session Number: 1

## 2022-12-06 ENCOUNTER — Ambulatory Visit
Admission: RE | Admit: 2022-12-06 | Discharge: 2022-12-06 | Disposition: A | Discharge status: Home / Self Care | Payer: Medicare PPO | Source: Ambulatory Visit | Attending: Radiation Oncology | Admitting: Radiation Oncology

## 2022-12-06 ENCOUNTER — Other Ambulatory Visit: Payer: Self-pay

## 2022-12-06 DIAGNOSIS — Z192 Hormone resistant malignancy status: Secondary | ICD-10-CM | POA: Diagnosis not present

## 2022-12-06 DIAGNOSIS — Z510 Encounter for antineoplastic radiation therapy: Secondary | ICD-10-CM | POA: Diagnosis not present

## 2022-12-06 DIAGNOSIS — C61 Malignant neoplasm of prostate: Secondary | ICD-10-CM | POA: Insufficient documentation

## 2022-12-06 LAB — RAD ONC ARIA SESSION SUMMARY
Course Elapsed Days: 1
Plan Fractions Treated to Date: 2
Plan Prescribed Dose Per Fraction: 1.8 Gy
Plan Total Fractions Prescribed: 25
Plan Total Prescribed Dose: 45 Gy
Reference Point Dosage Given to Date: 3.6 Gy
Reference Point Session Dosage Given: 1.8 Gy
Session Number: 2

## 2022-12-07 ENCOUNTER — Other Ambulatory Visit: Payer: Self-pay

## 2022-12-07 ENCOUNTER — Ambulatory Visit
Admission: RE | Admit: 2022-12-07 | Discharge: 2022-12-07 | Disposition: A | Discharge status: Home / Self Care | Payer: Medicare PPO | Source: Ambulatory Visit | Attending: Radiation Oncology | Admitting: Radiation Oncology

## 2022-12-07 DIAGNOSIS — C61 Malignant neoplasm of prostate: Secondary | ICD-10-CM | POA: Diagnosis not present

## 2022-12-07 DIAGNOSIS — Z192 Hormone resistant malignancy status: Secondary | ICD-10-CM | POA: Diagnosis not present

## 2022-12-07 DIAGNOSIS — Z510 Encounter for antineoplastic radiation therapy: Secondary | ICD-10-CM | POA: Diagnosis not present

## 2022-12-07 LAB — RAD ONC ARIA SESSION SUMMARY
Course Elapsed Days: 2
Plan Fractions Treated to Date: 3
Plan Prescribed Dose Per Fraction: 1.8 Gy
Plan Total Fractions Prescribed: 25
Plan Total Prescribed Dose: 45 Gy
Reference Point Dosage Given to Date: 5.4 Gy
Reference Point Session Dosage Given: 1.8 Gy
Session Number: 3

## 2022-12-10 ENCOUNTER — Other Ambulatory Visit: Payer: Self-pay

## 2022-12-10 ENCOUNTER — Ambulatory Visit
Admission: RE | Admit: 2022-12-10 | Discharge: 2022-12-10 | Disposition: A | Discharge status: Home / Self Care | Payer: Medicare PPO | Source: Ambulatory Visit | Attending: Radiation Oncology | Admitting: Radiation Oncology

## 2022-12-10 DIAGNOSIS — C61 Malignant neoplasm of prostate: Secondary | ICD-10-CM | POA: Diagnosis not present

## 2022-12-10 DIAGNOSIS — Z510 Encounter for antineoplastic radiation therapy: Secondary | ICD-10-CM | POA: Diagnosis not present

## 2022-12-10 DIAGNOSIS — Z192 Hormone resistant malignancy status: Secondary | ICD-10-CM | POA: Diagnosis not present

## 2022-12-10 LAB — RAD ONC ARIA SESSION SUMMARY
Course Elapsed Days: 5
Plan Fractions Treated to Date: 4
Plan Prescribed Dose Per Fraction: 1.8 Gy
Plan Total Fractions Prescribed: 25
Plan Total Prescribed Dose: 45 Gy
Reference Point Dosage Given to Date: 7.2 Gy
Reference Point Session Dosage Given: 1.8 Gy
Session Number: 4

## 2022-12-11 ENCOUNTER — Ambulatory Visit
Admission: RE | Admit: 2022-12-11 | Discharge: 2022-12-11 | Disposition: A | Discharge status: Home / Self Care | Payer: Medicare PPO | Source: Ambulatory Visit | Attending: Radiation Oncology | Admitting: Radiation Oncology

## 2022-12-11 ENCOUNTER — Other Ambulatory Visit: Payer: Self-pay

## 2022-12-11 DIAGNOSIS — Z510 Encounter for antineoplastic radiation therapy: Secondary | ICD-10-CM | POA: Diagnosis not present

## 2022-12-11 DIAGNOSIS — Z192 Hormone resistant malignancy status: Secondary | ICD-10-CM | POA: Diagnosis not present

## 2022-12-11 DIAGNOSIS — C61 Malignant neoplasm of prostate: Secondary | ICD-10-CM | POA: Diagnosis not present

## 2022-12-11 LAB — RAD ONC ARIA SESSION SUMMARY
Course Elapsed Days: 6
Plan Fractions Treated to Date: 5
Plan Prescribed Dose Per Fraction: 1.8 Gy
Plan Total Fractions Prescribed: 25
Plan Total Prescribed Dose: 45 Gy
Reference Point Dosage Given to Date: 9 Gy
Reference Point Session Dosage Given: 1.8 Gy
Session Number: 5

## 2022-12-12 ENCOUNTER — Ambulatory Visit
Admission: RE | Admit: 2022-12-12 | Discharge: 2022-12-12 | Disposition: A | Discharge status: Home / Self Care | Payer: Medicare PPO | Source: Ambulatory Visit | Attending: Radiation Oncology | Admitting: Radiation Oncology

## 2022-12-12 ENCOUNTER — Other Ambulatory Visit: Payer: Self-pay

## 2022-12-12 DIAGNOSIS — Z510 Encounter for antineoplastic radiation therapy: Secondary | ICD-10-CM | POA: Diagnosis not present

## 2022-12-12 DIAGNOSIS — C61 Malignant neoplasm of prostate: Secondary | ICD-10-CM | POA: Diagnosis not present

## 2022-12-12 DIAGNOSIS — Z192 Hormone resistant malignancy status: Secondary | ICD-10-CM | POA: Diagnosis not present

## 2022-12-12 LAB — RAD ONC ARIA SESSION SUMMARY
Course Elapsed Days: 7
Plan Fractions Treated to Date: 6
Plan Prescribed Dose Per Fraction: 1.8 Gy
Plan Total Fractions Prescribed: 25
Plan Total Prescribed Dose: 45 Gy
Reference Point Dosage Given to Date: 10.8 Gy
Reference Point Session Dosage Given: 1.8 Gy
Session Number: 6

## 2022-12-13 ENCOUNTER — Ambulatory Visit
Admission: RE | Admit: 2022-12-13 | Discharge: 2022-12-13 | Disposition: A | Discharge status: Home / Self Care | Payer: Medicare PPO | Source: Ambulatory Visit | Attending: Radiation Oncology | Admitting: Radiation Oncology

## 2022-12-13 ENCOUNTER — Other Ambulatory Visit: Payer: Self-pay

## 2022-12-13 DIAGNOSIS — Z510 Encounter for antineoplastic radiation therapy: Secondary | ICD-10-CM | POA: Diagnosis not present

## 2022-12-13 DIAGNOSIS — Z192 Hormone resistant malignancy status: Secondary | ICD-10-CM | POA: Diagnosis not present

## 2022-12-13 DIAGNOSIS — C61 Malignant neoplasm of prostate: Secondary | ICD-10-CM | POA: Diagnosis not present

## 2022-12-13 LAB — RAD ONC ARIA SESSION SUMMARY
Course Elapsed Days: 8
Plan Fractions Treated to Date: 7
Plan Prescribed Dose Per Fraction: 1.8 Gy
Plan Total Fractions Prescribed: 25
Plan Total Prescribed Dose: 45 Gy
Reference Point Dosage Given to Date: 12.6 Gy
Reference Point Session Dosage Given: 1.8 Gy
Session Number: 7

## 2022-12-14 ENCOUNTER — Other Ambulatory Visit: Payer: Self-pay

## 2022-12-14 ENCOUNTER — Ambulatory Visit
Admission: RE | Admit: 2022-12-14 | Discharge: 2022-12-14 | Disposition: A | Discharge status: Home / Self Care | Payer: Medicare PPO | Source: Ambulatory Visit | Attending: Radiation Oncology | Admitting: Radiation Oncology

## 2022-12-14 DIAGNOSIS — Z192 Hormone resistant malignancy status: Secondary | ICD-10-CM | POA: Diagnosis not present

## 2022-12-14 DIAGNOSIS — C61 Malignant neoplasm of prostate: Secondary | ICD-10-CM | POA: Diagnosis not present

## 2022-12-14 DIAGNOSIS — Z510 Encounter for antineoplastic radiation therapy: Secondary | ICD-10-CM | POA: Diagnosis not present

## 2022-12-14 LAB — RAD ONC ARIA SESSION SUMMARY
Course Elapsed Days: 9
Plan Fractions Treated to Date: 8
Plan Prescribed Dose Per Fraction: 1.8 Gy
Plan Total Fractions Prescribed: 25
Plan Total Prescribed Dose: 45 Gy
Reference Point Dosage Given to Date: 14.4 Gy
Reference Point Session Dosage Given: 1.8 Gy
Session Number: 8

## 2022-12-17 ENCOUNTER — Other Ambulatory Visit: Payer: Self-pay

## 2022-12-17 ENCOUNTER — Ambulatory Visit
Admission: RE | Admit: 2022-12-17 | Discharge: 2022-12-17 | Disposition: A | Discharge status: Home / Self Care | Payer: Medicare PPO | Source: Ambulatory Visit | Attending: Radiation Oncology | Admitting: Radiation Oncology

## 2022-12-17 DIAGNOSIS — C61 Malignant neoplasm of prostate: Secondary | ICD-10-CM | POA: Diagnosis not present

## 2022-12-17 DIAGNOSIS — Z510 Encounter for antineoplastic radiation therapy: Secondary | ICD-10-CM | POA: Diagnosis not present

## 2022-12-17 DIAGNOSIS — Z192 Hormone resistant malignancy status: Secondary | ICD-10-CM | POA: Diagnosis not present

## 2022-12-17 LAB — RAD ONC ARIA SESSION SUMMARY
Course Elapsed Days: 12
Plan Fractions Treated to Date: 9
Plan Prescribed Dose Per Fraction: 1.8 Gy
Plan Total Fractions Prescribed: 25
Plan Total Prescribed Dose: 45 Gy
Reference Point Dosage Given to Date: 16.2 Gy
Reference Point Session Dosage Given: 1.8 Gy
Session Number: 9

## 2022-12-18 ENCOUNTER — Ambulatory Visit
Admission: RE | Admit: 2022-12-18 | Discharge: 2022-12-18 | Disposition: A | Discharge status: Home / Self Care | Payer: Medicare PPO | Source: Ambulatory Visit | Attending: Radiation Oncology | Admitting: Radiation Oncology

## 2022-12-18 ENCOUNTER — Other Ambulatory Visit: Payer: Self-pay

## 2022-12-18 DIAGNOSIS — Z192 Hormone resistant malignancy status: Secondary | ICD-10-CM | POA: Diagnosis not present

## 2022-12-18 DIAGNOSIS — C61 Malignant neoplasm of prostate: Secondary | ICD-10-CM | POA: Diagnosis not present

## 2022-12-18 DIAGNOSIS — Z510 Encounter for antineoplastic radiation therapy: Secondary | ICD-10-CM | POA: Diagnosis not present

## 2022-12-18 LAB — RAD ONC ARIA SESSION SUMMARY
Course Elapsed Days: 13
Plan Fractions Treated to Date: 10
Plan Prescribed Dose Per Fraction: 1.8 Gy
Plan Total Fractions Prescribed: 25
Plan Total Prescribed Dose: 45 Gy
Reference Point Dosage Given to Date: 18 Gy
Reference Point Session Dosage Given: 1.8 Gy
Session Number: 10

## 2022-12-19 ENCOUNTER — Other Ambulatory Visit: Payer: Self-pay

## 2022-12-19 ENCOUNTER — Ambulatory Visit
Admission: RE | Admit: 2022-12-19 | Discharge: 2022-12-19 | Disposition: A | Discharge status: Home / Self Care | Payer: Medicare PPO | Source: Ambulatory Visit | Attending: Radiation Oncology | Admitting: Radiation Oncology

## 2022-12-19 DIAGNOSIS — Z510 Encounter for antineoplastic radiation therapy: Secondary | ICD-10-CM | POA: Diagnosis not present

## 2022-12-19 DIAGNOSIS — C61 Malignant neoplasm of prostate: Secondary | ICD-10-CM | POA: Diagnosis not present

## 2022-12-19 DIAGNOSIS — Z192 Hormone resistant malignancy status: Secondary | ICD-10-CM | POA: Diagnosis not present

## 2022-12-19 LAB — RAD ONC ARIA SESSION SUMMARY
Course Elapsed Days: 14
Plan Fractions Treated to Date: 11
Plan Prescribed Dose Per Fraction: 1.8 Gy
Plan Total Fractions Prescribed: 25
Plan Total Prescribed Dose: 45 Gy
Reference Point Dosage Given to Date: 19.8 Gy
Reference Point Session Dosage Given: 1.8 Gy
Session Number: 11

## 2022-12-20 ENCOUNTER — Ambulatory Visit
Admission: RE | Admit: 2022-12-20 | Discharge: 2022-12-20 | Disposition: A | Discharge status: Home / Self Care | Payer: Medicare PPO | Source: Ambulatory Visit | Attending: Radiation Oncology | Admitting: Radiation Oncology

## 2022-12-20 ENCOUNTER — Other Ambulatory Visit: Payer: Self-pay

## 2022-12-20 DIAGNOSIS — Z192 Hormone resistant malignancy status: Secondary | ICD-10-CM | POA: Diagnosis not present

## 2022-12-20 DIAGNOSIS — Z510 Encounter for antineoplastic radiation therapy: Secondary | ICD-10-CM | POA: Diagnosis not present

## 2022-12-20 DIAGNOSIS — C61 Malignant neoplasm of prostate: Secondary | ICD-10-CM | POA: Diagnosis not present

## 2022-12-20 LAB — RAD ONC ARIA SESSION SUMMARY
Course Elapsed Days: 15
Plan Fractions Treated to Date: 12
Plan Prescribed Dose Per Fraction: 1.8 Gy
Plan Total Fractions Prescribed: 25
Plan Total Prescribed Dose: 45 Gy
Reference Point Dosage Given to Date: 21.6 Gy
Reference Point Session Dosage Given: 1.8 Gy
Session Number: 12

## 2022-12-21 ENCOUNTER — Ambulatory Visit
Admission: RE | Admit: 2022-12-21 | Discharge: 2022-12-21 | Disposition: A | Discharge status: Home / Self Care | Payer: Medicare PPO | Source: Ambulatory Visit | Attending: Radiation Oncology | Admitting: Radiation Oncology

## 2022-12-21 ENCOUNTER — Other Ambulatory Visit: Payer: Self-pay

## 2022-12-21 DIAGNOSIS — Z192 Hormone resistant malignancy status: Secondary | ICD-10-CM | POA: Diagnosis not present

## 2022-12-21 DIAGNOSIS — Z510 Encounter for antineoplastic radiation therapy: Secondary | ICD-10-CM | POA: Diagnosis not present

## 2022-12-21 DIAGNOSIS — C61 Malignant neoplasm of prostate: Secondary | ICD-10-CM | POA: Diagnosis not present

## 2022-12-21 LAB — RAD ONC ARIA SESSION SUMMARY
Course Elapsed Days: 16
Plan Fractions Treated to Date: 13
Plan Prescribed Dose Per Fraction: 1.8 Gy
Plan Total Fractions Prescribed: 25
Plan Total Prescribed Dose: 45 Gy
Reference Point Dosage Given to Date: 23.4 Gy
Reference Point Session Dosage Given: 1.8 Gy
Session Number: 13

## 2022-12-24 ENCOUNTER — Other Ambulatory Visit: Payer: Self-pay

## 2022-12-24 ENCOUNTER — Ambulatory Visit
Admission: RE | Admit: 2022-12-24 | Discharge: 2022-12-24 | Disposition: A | Discharge status: Home / Self Care | Payer: Medicare PPO | Source: Ambulatory Visit | Attending: Radiation Oncology | Admitting: Radiation Oncology

## 2022-12-24 DIAGNOSIS — C61 Malignant neoplasm of prostate: Secondary | ICD-10-CM | POA: Diagnosis not present

## 2022-12-24 DIAGNOSIS — Z192 Hormone resistant malignancy status: Secondary | ICD-10-CM | POA: Diagnosis not present

## 2022-12-24 DIAGNOSIS — Z510 Encounter for antineoplastic radiation therapy: Secondary | ICD-10-CM | POA: Diagnosis not present

## 2022-12-24 LAB — RAD ONC ARIA SESSION SUMMARY
Course Elapsed Days: 19
Plan Fractions Treated to Date: 14
Plan Prescribed Dose Per Fraction: 1.8 Gy
Plan Total Fractions Prescribed: 25
Plan Total Prescribed Dose: 45 Gy
Reference Point Dosage Given to Date: 25.2 Gy
Reference Point Session Dosage Given: 1.8 Gy
Session Number: 14

## 2022-12-25 ENCOUNTER — Other Ambulatory Visit: Payer: Self-pay

## 2022-12-25 ENCOUNTER — Ambulatory Visit
Admission: RE | Admit: 2022-12-25 | Discharge: 2022-12-25 | Disposition: A | Discharge status: Home / Self Care | Payer: Medicare PPO | Source: Ambulatory Visit | Attending: Radiation Oncology | Admitting: Radiation Oncology

## 2022-12-25 DIAGNOSIS — Z192 Hormone resistant malignancy status: Secondary | ICD-10-CM | POA: Diagnosis not present

## 2022-12-25 DIAGNOSIS — C61 Malignant neoplasm of prostate: Secondary | ICD-10-CM | POA: Diagnosis not present

## 2022-12-25 DIAGNOSIS — Z510 Encounter for antineoplastic radiation therapy: Secondary | ICD-10-CM | POA: Diagnosis not present

## 2022-12-25 LAB — RAD ONC ARIA SESSION SUMMARY
Course Elapsed Days: 20
Plan Fractions Treated to Date: 15
Plan Prescribed Dose Per Fraction: 1.8 Gy
Plan Total Fractions Prescribed: 25
Plan Total Prescribed Dose: 45 Gy
Reference Point Dosage Given to Date: 27 Gy
Reference Point Session Dosage Given: 1.8 Gy
Session Number: 15

## 2022-12-26 ENCOUNTER — Ambulatory Visit
Admission: RE | Admit: 2022-12-26 | Discharge: 2022-12-26 | Disposition: A | Discharge status: Home / Self Care | Payer: Medicare PPO | Source: Ambulatory Visit | Attending: Radiation Oncology | Admitting: Radiation Oncology

## 2022-12-26 ENCOUNTER — Other Ambulatory Visit: Payer: Self-pay

## 2022-12-26 DIAGNOSIS — C61 Malignant neoplasm of prostate: Secondary | ICD-10-CM | POA: Diagnosis not present

## 2022-12-26 DIAGNOSIS — Z510 Encounter for antineoplastic radiation therapy: Secondary | ICD-10-CM | POA: Diagnosis not present

## 2022-12-26 DIAGNOSIS — Z192 Hormone resistant malignancy status: Secondary | ICD-10-CM | POA: Diagnosis not present

## 2022-12-26 LAB — RAD ONC ARIA SESSION SUMMARY
Course Elapsed Days: 21
Plan Fractions Treated to Date: 16
Plan Prescribed Dose Per Fraction: 1.8 Gy
Plan Total Fractions Prescribed: 25
Plan Total Prescribed Dose: 45 Gy
Reference Point Dosage Given to Date: 28.8 Gy
Reference Point Session Dosage Given: 1.8 Gy
Session Number: 16

## 2022-12-27 ENCOUNTER — Other Ambulatory Visit: Payer: Self-pay

## 2022-12-27 ENCOUNTER — Ambulatory Visit
Admission: RE | Admit: 2022-12-27 | Discharge: 2022-12-27 | Disposition: A | Discharge status: Home / Self Care | Payer: Medicare PPO | Source: Ambulatory Visit | Attending: Radiation Oncology | Admitting: Radiation Oncology

## 2022-12-27 DIAGNOSIS — Z510 Encounter for antineoplastic radiation therapy: Secondary | ICD-10-CM | POA: Diagnosis not present

## 2022-12-27 DIAGNOSIS — M818 Other osteoporosis without current pathological fracture: Secondary | ICD-10-CM | POA: Diagnosis not present

## 2022-12-27 DIAGNOSIS — Z192 Hormone resistant malignancy status: Secondary | ICD-10-CM | POA: Diagnosis not present

## 2022-12-27 DIAGNOSIS — C61 Malignant neoplasm of prostate: Secondary | ICD-10-CM | POA: Diagnosis not present

## 2022-12-27 LAB — RAD ONC ARIA SESSION SUMMARY
Course Elapsed Days: 22
Plan Fractions Treated to Date: 17
Plan Prescribed Dose Per Fraction: 1.8 Gy
Plan Total Fractions Prescribed: 25
Plan Total Prescribed Dose: 45 Gy
Reference Point Dosage Given to Date: 30.6 Gy
Reference Point Session Dosage Given: 1.8 Gy
Session Number: 17

## 2022-12-28 ENCOUNTER — Ambulatory Visit
Admission: RE | Admit: 2022-12-28 | Discharge: 2022-12-28 | Disposition: A | Discharge status: Home / Self Care | Payer: Medicare PPO | Source: Ambulatory Visit | Attending: Radiation Oncology | Admitting: Radiation Oncology

## 2022-12-28 ENCOUNTER — Other Ambulatory Visit: Payer: Self-pay

## 2022-12-28 DIAGNOSIS — Z510 Encounter for antineoplastic radiation therapy: Secondary | ICD-10-CM | POA: Diagnosis not present

## 2022-12-28 DIAGNOSIS — C61 Malignant neoplasm of prostate: Secondary | ICD-10-CM | POA: Diagnosis not present

## 2022-12-28 DIAGNOSIS — Z192 Hormone resistant malignancy status: Secondary | ICD-10-CM | POA: Diagnosis not present

## 2022-12-28 LAB — RAD ONC ARIA SESSION SUMMARY
Course Elapsed Days: 23
Plan Fractions Treated to Date: 18
Plan Prescribed Dose Per Fraction: 1.8 Gy
Plan Total Fractions Prescribed: 25
Plan Total Prescribed Dose: 45 Gy
Reference Point Dosage Given to Date: 32.4 Gy
Reference Point Session Dosage Given: 1.8 Gy
Session Number: 18

## 2022-12-31 ENCOUNTER — Other Ambulatory Visit: Payer: Self-pay

## 2022-12-31 ENCOUNTER — Ambulatory Visit
Admission: RE | Admit: 2022-12-31 | Discharge: 2022-12-31 | Disposition: A | Discharge status: Home / Self Care | Payer: Medicare PPO | Source: Ambulatory Visit | Attending: Radiation Oncology | Admitting: Radiation Oncology

## 2022-12-31 DIAGNOSIS — C61 Malignant neoplasm of prostate: Secondary | ICD-10-CM | POA: Diagnosis not present

## 2022-12-31 DIAGNOSIS — Z510 Encounter for antineoplastic radiation therapy: Secondary | ICD-10-CM | POA: Diagnosis not present

## 2022-12-31 DIAGNOSIS — Z192 Hormone resistant malignancy status: Secondary | ICD-10-CM | POA: Diagnosis not present

## 2022-12-31 LAB — RAD ONC ARIA SESSION SUMMARY
Course Elapsed Days: 26
Plan Fractions Treated to Date: 19
Plan Prescribed Dose Per Fraction: 1.8 Gy
Plan Total Fractions Prescribed: 25
Plan Total Prescribed Dose: 45 Gy
Reference Point Dosage Given to Date: 34.2 Gy
Reference Point Session Dosage Given: 1.8 Gy
Session Number: 19

## 2023-01-01 ENCOUNTER — Ambulatory Visit
Admission: RE | Admit: 2023-01-01 | Discharge: 2023-01-01 | Disposition: A | Discharge status: Home / Self Care | Payer: Medicare PPO | Source: Ambulatory Visit | Attending: Radiation Oncology | Admitting: Radiation Oncology

## 2023-01-01 ENCOUNTER — Other Ambulatory Visit: Payer: Self-pay

## 2023-01-01 DIAGNOSIS — C61 Malignant neoplasm of prostate: Secondary | ICD-10-CM | POA: Diagnosis not present

## 2023-01-01 DIAGNOSIS — Z192 Hormone resistant malignancy status: Secondary | ICD-10-CM | POA: Diagnosis not present

## 2023-01-01 DIAGNOSIS — Z510 Encounter for antineoplastic radiation therapy: Secondary | ICD-10-CM | POA: Diagnosis not present

## 2023-01-01 LAB — RAD ONC ARIA SESSION SUMMARY
Course Elapsed Days: 27
Plan Fractions Treated to Date: 20
Plan Prescribed Dose Per Fraction: 1.8 Gy
Plan Total Fractions Prescribed: 25
Plan Total Prescribed Dose: 45 Gy
Reference Point Dosage Given to Date: 36 Gy
Reference Point Session Dosage Given: 1.8 Gy
Session Number: 20

## 2023-01-02 ENCOUNTER — Other Ambulatory Visit: Payer: Self-pay

## 2023-01-02 ENCOUNTER — Ambulatory Visit
Admission: RE | Admit: 2023-01-02 | Discharge: 2023-01-02 | Disposition: A | Discharge status: Home / Self Care | Payer: Medicare PPO | Source: Ambulatory Visit | Attending: Radiation Oncology | Admitting: Radiation Oncology

## 2023-01-02 DIAGNOSIS — Z510 Encounter for antineoplastic radiation therapy: Secondary | ICD-10-CM | POA: Diagnosis not present

## 2023-01-02 DIAGNOSIS — C61 Malignant neoplasm of prostate: Secondary | ICD-10-CM | POA: Diagnosis not present

## 2023-01-02 DIAGNOSIS — Z192 Hormone resistant malignancy status: Secondary | ICD-10-CM | POA: Diagnosis not present

## 2023-01-02 LAB — RAD ONC ARIA SESSION SUMMARY
Course Elapsed Days: 28
Plan Fractions Treated to Date: 21
Plan Prescribed Dose Per Fraction: 1.8 Gy
Plan Total Fractions Prescribed: 25
Plan Total Prescribed Dose: 45 Gy
Reference Point Dosage Given to Date: 37.8 Gy
Reference Point Session Dosage Given: 1.8 Gy
Session Number: 21

## 2023-01-03 ENCOUNTER — Other Ambulatory Visit: Payer: Self-pay

## 2023-01-03 ENCOUNTER — Ambulatory Visit
Admission: RE | Admit: 2023-01-03 | Discharge: 2023-01-03 | Disposition: A | Discharge status: Home / Self Care | Payer: Medicare PPO | Source: Ambulatory Visit | Attending: Radiation Oncology | Admitting: Radiation Oncology

## 2023-01-03 DIAGNOSIS — Z192 Hormone resistant malignancy status: Secondary | ICD-10-CM | POA: Diagnosis not present

## 2023-01-03 DIAGNOSIS — C61 Malignant neoplasm of prostate: Secondary | ICD-10-CM | POA: Diagnosis not present

## 2023-01-03 DIAGNOSIS — Z510 Encounter for antineoplastic radiation therapy: Secondary | ICD-10-CM | POA: Diagnosis not present

## 2023-01-03 LAB — RAD ONC ARIA SESSION SUMMARY
Course Elapsed Days: 29
Plan Fractions Treated to Date: 22
Plan Prescribed Dose Per Fraction: 1.8 Gy
Plan Total Fractions Prescribed: 25
Plan Total Prescribed Dose: 45 Gy
Reference Point Dosage Given to Date: 39.6 Gy
Reference Point Session Dosage Given: 1.8 Gy
Session Number: 22

## 2023-01-04 ENCOUNTER — Other Ambulatory Visit: Payer: Self-pay

## 2023-01-04 ENCOUNTER — Ambulatory Visit: Payer: Medicare PPO

## 2023-01-04 ENCOUNTER — Ambulatory Visit
Admission: RE | Admit: 2023-01-04 | Discharge: 2023-01-04 | Disposition: A | Discharge status: Home / Self Care | Payer: Medicare PPO | Source: Ambulatory Visit | Attending: Radiation Oncology | Admitting: Radiation Oncology

## 2023-01-04 DIAGNOSIS — C61 Malignant neoplasm of prostate: Secondary | ICD-10-CM | POA: Diagnosis not present

## 2023-01-04 DIAGNOSIS — Z510 Encounter for antineoplastic radiation therapy: Secondary | ICD-10-CM | POA: Diagnosis not present

## 2023-01-04 DIAGNOSIS — Z192 Hormone resistant malignancy status: Secondary | ICD-10-CM | POA: Diagnosis not present

## 2023-01-04 LAB — RAD ONC ARIA SESSION SUMMARY
Course Elapsed Days: 30
Plan Fractions Treated to Date: 23
Plan Prescribed Dose Per Fraction: 1.8 Gy
Plan Total Fractions Prescribed: 25
Plan Total Prescribed Dose: 45 Gy
Reference Point Dosage Given to Date: 41.4 Gy
Reference Point Session Dosage Given: 1.8 Gy
Session Number: 23

## 2023-01-07 ENCOUNTER — Ambulatory Visit
Admission: RE | Admit: 2023-01-07 | Discharge: 2023-01-07 | Disposition: A | Discharge status: Home / Self Care | Payer: Medicare PPO | Source: Ambulatory Visit | Attending: Radiation Oncology | Admitting: Radiation Oncology

## 2023-01-07 ENCOUNTER — Other Ambulatory Visit: Payer: Self-pay

## 2023-01-07 DIAGNOSIS — Z510 Encounter for antineoplastic radiation therapy: Secondary | ICD-10-CM | POA: Diagnosis not present

## 2023-01-07 DIAGNOSIS — Z192 Hormone resistant malignancy status: Secondary | ICD-10-CM | POA: Diagnosis not present

## 2023-01-07 DIAGNOSIS — C61 Malignant neoplasm of prostate: Secondary | ICD-10-CM | POA: Diagnosis not present

## 2023-01-07 LAB — RAD ONC ARIA SESSION SUMMARY
Course Elapsed Days: 33
Plan Fractions Treated to Date: 24
Plan Prescribed Dose Per Fraction: 1.8 Gy
Plan Total Fractions Prescribed: 25
Plan Total Prescribed Dose: 45 Gy
Reference Point Dosage Given to Date: 43.2 Gy
Reference Point Session Dosage Given: 1.8 Gy
Session Number: 24

## 2023-01-08 ENCOUNTER — Other Ambulatory Visit: Payer: Self-pay

## 2023-01-08 ENCOUNTER — Ambulatory Visit
Admission: RE | Admit: 2023-01-08 | Discharge: 2023-01-08 | Disposition: A | Discharge status: Home / Self Care | Payer: Medicare PPO | Source: Ambulatory Visit | Attending: Radiation Oncology | Admitting: Radiation Oncology

## 2023-01-08 DIAGNOSIS — Z510 Encounter for antineoplastic radiation therapy: Secondary | ICD-10-CM | POA: Diagnosis not present

## 2023-01-08 DIAGNOSIS — C61 Malignant neoplasm of prostate: Secondary | ICD-10-CM | POA: Diagnosis not present

## 2023-01-08 DIAGNOSIS — Z192 Hormone resistant malignancy status: Secondary | ICD-10-CM | POA: Diagnosis not present

## 2023-01-08 LAB — RAD ONC ARIA SESSION SUMMARY
Course Elapsed Days: 34
Plan Fractions Treated to Date: 25
Plan Prescribed Dose Per Fraction: 1.8 Gy
Plan Total Fractions Prescribed: 25
Plan Total Prescribed Dose: 45 Gy
Reference Point Dosage Given to Date: 45 Gy
Reference Point Session Dosage Given: 1.8 Gy
Session Number: 25

## 2023-01-09 ENCOUNTER — Ambulatory Visit
Admission: RE | Admit: 2023-01-09 | Discharge: 2023-01-09 | Disposition: A | Discharge status: Home / Self Care | Payer: Medicare PPO | Source: Ambulatory Visit | Attending: Radiation Oncology | Admitting: Radiation Oncology

## 2023-01-09 ENCOUNTER — Other Ambulatory Visit: Payer: Self-pay

## 2023-01-09 DIAGNOSIS — C61 Malignant neoplasm of prostate: Secondary | ICD-10-CM | POA: Diagnosis not present

## 2023-01-09 DIAGNOSIS — Z192 Hormone resistant malignancy status: Secondary | ICD-10-CM | POA: Diagnosis not present

## 2023-01-09 DIAGNOSIS — Z510 Encounter for antineoplastic radiation therapy: Secondary | ICD-10-CM | POA: Diagnosis not present

## 2023-01-09 LAB — RAD ONC ARIA SESSION SUMMARY
Course Elapsed Days: 35
Plan Fractions Treated to Date: 1
Plan Prescribed Dose Per Fraction: 2 Gy
Plan Total Fractions Prescribed: 15
Plan Total Prescribed Dose: 30 Gy
Reference Point Dosage Given to Date: 2 Gy
Reference Point Session Dosage Given: 2 Gy
Session Number: 26

## 2023-01-10 ENCOUNTER — Other Ambulatory Visit: Payer: Self-pay

## 2023-01-10 ENCOUNTER — Ambulatory Visit
Admission: RE | Admit: 2023-01-10 | Discharge: 2023-01-10 | Disposition: A | Discharge status: Home / Self Care | Payer: Medicare PPO | Source: Ambulatory Visit | Attending: Radiation Oncology | Admitting: Radiation Oncology

## 2023-01-10 DIAGNOSIS — Z192 Hormone resistant malignancy status: Secondary | ICD-10-CM | POA: Diagnosis not present

## 2023-01-10 DIAGNOSIS — C61 Malignant neoplasm of prostate: Secondary | ICD-10-CM | POA: Diagnosis not present

## 2023-01-10 DIAGNOSIS — Z510 Encounter for antineoplastic radiation therapy: Secondary | ICD-10-CM | POA: Diagnosis not present

## 2023-01-10 LAB — RAD ONC ARIA SESSION SUMMARY
Course Elapsed Days: 36
Plan Fractions Treated to Date: 2
Plan Prescribed Dose Per Fraction: 2 Gy
Plan Total Fractions Prescribed: 15
Plan Total Prescribed Dose: 30 Gy
Reference Point Dosage Given to Date: 4 Gy
Reference Point Session Dosage Given: 2 Gy
Session Number: 27

## 2023-01-11 ENCOUNTER — Other Ambulatory Visit: Payer: Self-pay

## 2023-01-11 ENCOUNTER — Ambulatory Visit
Admission: RE | Admit: 2023-01-11 | Discharge: 2023-01-11 | Disposition: A | Discharge status: Home / Self Care | Payer: Medicare PPO | Source: Ambulatory Visit | Attending: Radiation Oncology | Admitting: Radiation Oncology

## 2023-01-11 DIAGNOSIS — C61 Malignant neoplasm of prostate: Secondary | ICD-10-CM | POA: Diagnosis not present

## 2023-01-11 DIAGNOSIS — Z510 Encounter for antineoplastic radiation therapy: Secondary | ICD-10-CM | POA: Diagnosis not present

## 2023-01-11 DIAGNOSIS — Z192 Hormone resistant malignancy status: Secondary | ICD-10-CM | POA: Diagnosis not present

## 2023-01-11 LAB — RAD ONC ARIA SESSION SUMMARY
Course Elapsed Days: 37
Plan Fractions Treated to Date: 3
Plan Prescribed Dose Per Fraction: 2 Gy
Plan Total Fractions Prescribed: 15
Plan Total Prescribed Dose: 30 Gy
Reference Point Dosage Given to Date: 6 Gy
Reference Point Session Dosage Given: 2 Gy
Session Number: 28

## 2023-01-14 ENCOUNTER — Other Ambulatory Visit: Payer: Self-pay

## 2023-01-14 ENCOUNTER — Ambulatory Visit
Admission: RE | Admit: 2023-01-14 | Discharge: 2023-01-14 | Disposition: A | Discharge status: Home / Self Care | Payer: Medicare PPO | Source: Ambulatory Visit | Attending: Radiation Oncology | Admitting: Radiation Oncology

## 2023-01-14 DIAGNOSIS — Z510 Encounter for antineoplastic radiation therapy: Secondary | ICD-10-CM | POA: Diagnosis not present

## 2023-01-14 DIAGNOSIS — C61 Malignant neoplasm of prostate: Secondary | ICD-10-CM | POA: Diagnosis not present

## 2023-01-14 DIAGNOSIS — Z192 Hormone resistant malignancy status: Secondary | ICD-10-CM | POA: Diagnosis not present

## 2023-01-14 LAB — RAD ONC ARIA SESSION SUMMARY
Course Elapsed Days: 40
Plan Fractions Treated to Date: 4
Plan Prescribed Dose Per Fraction: 2 Gy
Plan Total Fractions Prescribed: 15
Plan Total Prescribed Dose: 30 Gy
Reference Point Dosage Given to Date: 8 Gy
Reference Point Session Dosage Given: 2 Gy
Session Number: 29

## 2023-01-15 ENCOUNTER — Ambulatory Visit
Admission: RE | Admit: 2023-01-15 | Discharge: 2023-01-15 | Disposition: A | Discharge status: Home / Self Care | Payer: Medicare PPO | Source: Ambulatory Visit | Attending: Radiation Oncology | Admitting: Radiation Oncology

## 2023-01-15 ENCOUNTER — Other Ambulatory Visit: Payer: Self-pay

## 2023-01-15 DIAGNOSIS — Z510 Encounter for antineoplastic radiation therapy: Secondary | ICD-10-CM | POA: Diagnosis not present

## 2023-01-15 DIAGNOSIS — Z192 Hormone resistant malignancy status: Secondary | ICD-10-CM | POA: Diagnosis not present

## 2023-01-15 DIAGNOSIS — C61 Malignant neoplasm of prostate: Secondary | ICD-10-CM | POA: Diagnosis not present

## 2023-01-15 LAB — RAD ONC ARIA SESSION SUMMARY
Course Elapsed Days: 41
Plan Fractions Treated to Date: 5
Plan Prescribed Dose Per Fraction: 2 Gy
Plan Total Fractions Prescribed: 15
Plan Total Prescribed Dose: 30 Gy
Reference Point Dosage Given to Date: 10 Gy
Reference Point Session Dosage Given: 2 Gy
Session Number: 30

## 2023-01-16 ENCOUNTER — Ambulatory Visit
Admission: RE | Admit: 2023-01-16 | Discharge: 2023-01-16 | Disposition: A | Discharge status: Home / Self Care | Payer: Medicare PPO | Source: Ambulatory Visit | Attending: Radiation Oncology | Admitting: Radiation Oncology

## 2023-01-16 ENCOUNTER — Other Ambulatory Visit: Payer: Self-pay

## 2023-01-16 DIAGNOSIS — C61 Malignant neoplasm of prostate: Secondary | ICD-10-CM | POA: Diagnosis not present

## 2023-01-16 DIAGNOSIS — Z510 Encounter for antineoplastic radiation therapy: Secondary | ICD-10-CM | POA: Diagnosis not present

## 2023-01-16 DIAGNOSIS — Z192 Hormone resistant malignancy status: Secondary | ICD-10-CM | POA: Diagnosis not present

## 2023-01-16 LAB — RAD ONC ARIA SESSION SUMMARY
Course Elapsed Days: 42
Plan Fractions Treated to Date: 6
Plan Prescribed Dose Per Fraction: 2 Gy
Plan Total Fractions Prescribed: 15
Plan Total Prescribed Dose: 30 Gy
Reference Point Dosage Given to Date: 12 Gy
Reference Point Session Dosage Given: 2 Gy
Session Number: 31

## 2023-01-17 ENCOUNTER — Ambulatory Visit
Admission: RE | Admit: 2023-01-17 | Discharge: 2023-01-17 | Disposition: A | Discharge status: Home / Self Care | Payer: Medicare PPO | Source: Ambulatory Visit | Attending: Radiation Oncology | Admitting: Radiation Oncology

## 2023-01-17 ENCOUNTER — Other Ambulatory Visit: Payer: Self-pay

## 2023-01-17 DIAGNOSIS — K2270 Barrett's esophagus without dysplasia: Secondary | ICD-10-CM | POA: Diagnosis not present

## 2023-01-17 DIAGNOSIS — D509 Iron deficiency anemia, unspecified: Secondary | ICD-10-CM | POA: Diagnosis not present

## 2023-01-17 DIAGNOSIS — C61 Malignant neoplasm of prostate: Secondary | ICD-10-CM | POA: Diagnosis not present

## 2023-01-17 DIAGNOSIS — I10 Essential (primary) hypertension: Secondary | ICD-10-CM | POA: Diagnosis not present

## 2023-01-17 DIAGNOSIS — E785 Hyperlipidemia, unspecified: Secondary | ICD-10-CM | POA: Diagnosis not present

## 2023-01-17 DIAGNOSIS — Z510 Encounter for antineoplastic radiation therapy: Secondary | ICD-10-CM | POA: Diagnosis not present

## 2023-01-17 DIAGNOSIS — E118 Type 2 diabetes mellitus with unspecified complications: Secondary | ICD-10-CM | POA: Diagnosis not present

## 2023-01-17 DIAGNOSIS — Z192 Hormone resistant malignancy status: Secondary | ICD-10-CM | POA: Diagnosis not present

## 2023-01-17 LAB — RAD ONC ARIA SESSION SUMMARY
Course Elapsed Days: 43
Plan Fractions Treated to Date: 7
Plan Prescribed Dose Per Fraction: 2 Gy
Plan Total Fractions Prescribed: 15
Plan Total Prescribed Dose: 30 Gy
Reference Point Dosage Given to Date: 14 Gy
Reference Point Session Dosage Given: 2 Gy
Session Number: 32

## 2023-01-18 ENCOUNTER — Ambulatory Visit
Admission: RE | Admit: 2023-01-18 | Discharge: 2023-01-18 | Disposition: A | Discharge status: Home / Self Care | Payer: Medicare PPO | Source: Ambulatory Visit | Attending: Radiation Oncology | Admitting: Radiation Oncology

## 2023-01-18 ENCOUNTER — Other Ambulatory Visit: Payer: Self-pay

## 2023-01-18 DIAGNOSIS — Z510 Encounter for antineoplastic radiation therapy: Secondary | ICD-10-CM | POA: Diagnosis not present

## 2023-01-18 DIAGNOSIS — Z192 Hormone resistant malignancy status: Secondary | ICD-10-CM | POA: Diagnosis not present

## 2023-01-18 DIAGNOSIS — C61 Malignant neoplasm of prostate: Secondary | ICD-10-CM | POA: Diagnosis not present

## 2023-01-18 LAB — RAD ONC ARIA SESSION SUMMARY
Course Elapsed Days: 44
Plan Fractions Treated to Date: 8
Plan Prescribed Dose Per Fraction: 2 Gy
Plan Total Fractions Prescribed: 15
Plan Total Prescribed Dose: 30 Gy
Reference Point Dosage Given to Date: 16 Gy
Reference Point Session Dosage Given: 2 Gy
Session Number: 33

## 2023-01-21 ENCOUNTER — Other Ambulatory Visit: Payer: Self-pay

## 2023-01-21 ENCOUNTER — Ambulatory Visit
Admission: RE | Admit: 2023-01-21 | Discharge: 2023-01-21 | Disposition: A | Discharge status: Home / Self Care | Payer: Medicare PPO | Source: Ambulatory Visit | Attending: Radiation Oncology | Admitting: Radiation Oncology

## 2023-01-21 DIAGNOSIS — Z192 Hormone resistant malignancy status: Secondary | ICD-10-CM | POA: Diagnosis not present

## 2023-01-21 DIAGNOSIS — Z510 Encounter for antineoplastic radiation therapy: Secondary | ICD-10-CM | POA: Diagnosis not present

## 2023-01-21 DIAGNOSIS — C61 Malignant neoplasm of prostate: Secondary | ICD-10-CM | POA: Diagnosis not present

## 2023-01-21 LAB — RAD ONC ARIA SESSION SUMMARY
Course Elapsed Days: 47
Plan Fractions Treated to Date: 9
Plan Prescribed Dose Per Fraction: 2 Gy
Plan Total Fractions Prescribed: 15
Plan Total Prescribed Dose: 30 Gy
Reference Point Dosage Given to Date: 18 Gy
Reference Point Session Dosage Given: 2 Gy
Session Number: 34

## 2023-01-22 ENCOUNTER — Ambulatory Visit
Admission: RE | Admit: 2023-01-22 | Discharge: 2023-01-22 | Disposition: A | Discharge status: Home / Self Care | Payer: Medicare PPO | Source: Ambulatory Visit | Attending: Radiation Oncology | Admitting: Radiation Oncology

## 2023-01-22 ENCOUNTER — Other Ambulatory Visit: Payer: Self-pay

## 2023-01-22 DIAGNOSIS — Z192 Hormone resistant malignancy status: Secondary | ICD-10-CM | POA: Diagnosis not present

## 2023-01-22 DIAGNOSIS — C61 Malignant neoplasm of prostate: Secondary | ICD-10-CM | POA: Diagnosis not present

## 2023-01-22 DIAGNOSIS — Z510 Encounter for antineoplastic radiation therapy: Secondary | ICD-10-CM | POA: Diagnosis not present

## 2023-01-22 LAB — RAD ONC ARIA SESSION SUMMARY
Course Elapsed Days: 48
Plan Fractions Treated to Date: 10
Plan Prescribed Dose Per Fraction: 2 Gy
Plan Total Fractions Prescribed: 15
Plan Total Prescribed Dose: 30 Gy
Reference Point Dosage Given to Date: 20 Gy
Reference Point Session Dosage Given: 2 Gy
Session Number: 35

## 2023-01-23 ENCOUNTER — Ambulatory Visit
Admission: RE | Admit: 2023-01-23 | Discharge: 2023-01-23 | Disposition: A | Discharge status: Home / Self Care | Payer: Medicare PPO | Source: Ambulatory Visit | Attending: Radiation Oncology | Admitting: Radiation Oncology

## 2023-01-23 ENCOUNTER — Other Ambulatory Visit: Payer: Self-pay

## 2023-01-23 DIAGNOSIS — C61 Malignant neoplasm of prostate: Secondary | ICD-10-CM | POA: Diagnosis not present

## 2023-01-23 DIAGNOSIS — Z510 Encounter for antineoplastic radiation therapy: Secondary | ICD-10-CM | POA: Diagnosis not present

## 2023-01-23 DIAGNOSIS — Z192 Hormone resistant malignancy status: Secondary | ICD-10-CM | POA: Diagnosis not present

## 2023-01-23 LAB — RAD ONC ARIA SESSION SUMMARY
Course Elapsed Days: 49
Plan Fractions Treated to Date: 11
Plan Prescribed Dose Per Fraction: 2 Gy
Plan Total Fractions Prescribed: 15
Plan Total Prescribed Dose: 30 Gy
Reference Point Dosage Given to Date: 22 Gy
Reference Point Session Dosage Given: 2 Gy
Session Number: 36

## 2023-01-24 ENCOUNTER — Ambulatory Visit
Admission: RE | Admit: 2023-01-24 | Discharge: 2023-01-24 | Disposition: A | Discharge status: Home / Self Care | Payer: Medicare PPO | Source: Ambulatory Visit | Attending: Radiation Oncology | Admitting: Radiation Oncology

## 2023-01-24 ENCOUNTER — Other Ambulatory Visit: Payer: Self-pay

## 2023-01-24 DIAGNOSIS — K2270 Barrett's esophagus without dysplasia: Secondary | ICD-10-CM | POA: Diagnosis not present

## 2023-01-24 DIAGNOSIS — D509 Iron deficiency anemia, unspecified: Secondary | ICD-10-CM | POA: Diagnosis not present

## 2023-01-24 DIAGNOSIS — E118 Type 2 diabetes mellitus with unspecified complications: Secondary | ICD-10-CM | POA: Diagnosis not present

## 2023-01-24 DIAGNOSIS — I10 Essential (primary) hypertension: Secondary | ICD-10-CM | POA: Diagnosis not present

## 2023-01-24 DIAGNOSIS — Z510 Encounter for antineoplastic radiation therapy: Secondary | ICD-10-CM | POA: Diagnosis not present

## 2023-01-24 DIAGNOSIS — C61 Malignant neoplasm of prostate: Secondary | ICD-10-CM | POA: Diagnosis not present

## 2023-01-24 DIAGNOSIS — E785 Hyperlipidemia, unspecified: Secondary | ICD-10-CM | POA: Diagnosis not present

## 2023-01-24 DIAGNOSIS — Z192 Hormone resistant malignancy status: Secondary | ICD-10-CM | POA: Diagnosis not present

## 2023-01-24 LAB — RAD ONC ARIA SESSION SUMMARY
Course Elapsed Days: 50
Plan Fractions Treated to Date: 12
Plan Prescribed Dose Per Fraction: 2 Gy
Plan Total Fractions Prescribed: 15
Plan Total Prescribed Dose: 30 Gy
Reference Point Dosage Given to Date: 24 Gy
Reference Point Session Dosage Given: 2 Gy
Session Number: 37

## 2023-01-25 ENCOUNTER — Ambulatory Visit
Admission: RE | Admit: 2023-01-25 | Discharge: 2023-01-25 | Disposition: A | Discharge status: Home / Self Care | Payer: Medicare PPO | Source: Ambulatory Visit | Attending: Radiation Oncology | Admitting: Radiation Oncology

## 2023-01-25 ENCOUNTER — Other Ambulatory Visit: Payer: Self-pay

## 2023-01-25 ENCOUNTER — Ambulatory Visit: Payer: Medicare PPO

## 2023-01-25 DIAGNOSIS — C61 Malignant neoplasm of prostate: Secondary | ICD-10-CM | POA: Diagnosis not present

## 2023-01-25 DIAGNOSIS — Z192 Hormone resistant malignancy status: Secondary | ICD-10-CM | POA: Diagnosis not present

## 2023-01-25 DIAGNOSIS — Z510 Encounter for antineoplastic radiation therapy: Secondary | ICD-10-CM | POA: Diagnosis not present

## 2023-01-25 LAB — RAD ONC ARIA SESSION SUMMARY
Course Elapsed Days: 51
Plan Fractions Treated to Date: 13
Plan Prescribed Dose Per Fraction: 2 Gy
Plan Total Fractions Prescribed: 15
Plan Total Prescribed Dose: 30 Gy
Reference Point Dosage Given to Date: 26 Gy
Reference Point Session Dosage Given: 2 Gy
Session Number: 38

## 2023-01-28 ENCOUNTER — Other Ambulatory Visit: Payer: Self-pay

## 2023-01-28 ENCOUNTER — Ambulatory Visit
Admission: RE | Admit: 2023-01-28 | Discharge: 2023-01-28 | Disposition: A | Discharge status: Home / Self Care | Payer: Medicare PPO | Source: Ambulatory Visit | Attending: Radiation Oncology | Admitting: Radiation Oncology

## 2023-01-28 DIAGNOSIS — C61 Malignant neoplasm of prostate: Secondary | ICD-10-CM | POA: Diagnosis not present

## 2023-01-28 DIAGNOSIS — Z510 Encounter for antineoplastic radiation therapy: Secondary | ICD-10-CM | POA: Diagnosis not present

## 2023-01-28 DIAGNOSIS — Z192 Hormone resistant malignancy status: Secondary | ICD-10-CM | POA: Diagnosis not present

## 2023-01-28 LAB — RAD ONC ARIA SESSION SUMMARY
Course Elapsed Days: 54
Plan Fractions Treated to Date: 14
Plan Prescribed Dose Per Fraction: 2 Gy
Plan Total Fractions Prescribed: 15
Plan Total Prescribed Dose: 30 Gy
Reference Point Dosage Given to Date: 28 Gy
Reference Point Session Dosage Given: 2 Gy
Session Number: 39

## 2023-01-29 ENCOUNTER — Encounter: Payer: Self-pay | Admitting: Urology

## 2023-01-29 ENCOUNTER — Other Ambulatory Visit: Payer: Self-pay

## 2023-01-29 ENCOUNTER — Ambulatory Visit
Admission: RE | Admit: 2023-01-29 | Discharge: 2023-01-29 | Disposition: A | Discharge status: Home / Self Care | Payer: Medicare PPO | Source: Ambulatory Visit | Attending: Radiation Oncology | Admitting: Radiation Oncology

## 2023-01-29 DIAGNOSIS — Z192 Hormone resistant malignancy status: Secondary | ICD-10-CM | POA: Diagnosis not present

## 2023-01-29 DIAGNOSIS — Z510 Encounter for antineoplastic radiation therapy: Secondary | ICD-10-CM | POA: Diagnosis not present

## 2023-01-29 DIAGNOSIS — C61 Malignant neoplasm of prostate: Secondary | ICD-10-CM | POA: Diagnosis not present

## 2023-01-29 LAB — RAD ONC ARIA SESSION SUMMARY
Course Elapsed Days: 55
Plan Fractions Treated to Date: 15
Plan Prescribed Dose Per Fraction: 2 Gy
Plan Total Fractions Prescribed: 15
Plan Total Prescribed Dose: 30 Gy
Reference Point Dosage Given to Date: 30 Gy
Reference Point Session Dosage Given: 2 Gy
Session Number: 40

## 2023-02-22 ENCOUNTER — Other Ambulatory Visit: Payer: Self-pay | Admitting: Urology

## 2023-02-22 DIAGNOSIS — C61 Malignant neoplasm of prostate: Secondary | ICD-10-CM

## 2023-02-22 NOTE — Progress Notes (Signed)
  Radiation Oncology         [phone] ________________________________  Name: [name] MRN: [id number]  Date: 01/29/2023  DOB: 30-Dec-1945  End of Treatment Note  Diagnosis:   77 y.o. gentleman with a rising PSA of 3.8 on total androgen blockade with ADT and Xtandi, s/p cryotherapy x2 for Gleason 4+4 prostate cancer.      Indication for treatment:  Curative, Definitive Radiotherapy concurrent with total androgen blockade (Eligard/Xtandi)     Radiation treatment dates:   12/05/22 - 01/29/23  Site/dose:  1. The prostate, seminal vesicles, and pelvic lymph nodes were initially treated to 45 Gy in 25 fractions of 1.8 Gy  2. The prostate only was boosted to 75 Gy with 15 additional fractions of 2.0 Gy   Beams/energy:  1. The prostate, seminal vesicles, and pelvic lymph nodes were initially treated using VMAT intensity modulated radiotherapy delivering 6 megavolt photons. Image guidance was performed with CB-CT studies prior to each fraction. He was immobilized with a body fix lower extremity mold.  2. the prostate only was boosted using VMAT intensity modulated radiotherapy delivering 6 megavolt photons. Image guidance was performed with CB-CT studies prior to each fraction. He was immobilized with a body fix lower extremity mold.  Narrative: The patient tolerated radiation treatment relatively well.   The patient experienced some minor urinary irritation and modest fatigue.  He did report some mildly increased nocturia and occasional diarrhea but did not require any medical management.  Plan: The patient will receive a call in about one month from the radiation oncology department. He will continue follow up with his urologist, Dr. Berneice Heinrich, as well.  He is scheduled for posttreatment labs on 03/28/2023 and will see Dr. Berneice Heinrich the following week _____________  Artist Pais. Kathrynn Running, M.D.

## 2023-02-26 ENCOUNTER — Ambulatory Visit
Admission: RE | Admit: 2023-02-26 | Discharge: 2023-02-26 | Disposition: A | Discharge status: Home / Self Care | Payer: Medicare PPO | Source: Ambulatory Visit | Attending: Radiation Oncology | Admitting: Radiation Oncology

## 2023-02-26 NOTE — Progress Notes (Addendum)
  Radiation Oncology         [phone] ________________________________  Name: [name] [id number]  Date of Service: 02/26/2023  DOB: 03-06-1946  Post Treatment Telephone Note  Diagnosis:  77 y.o. gentleman with a rising PSA of 3.8 on total androgen blockade with ADT and Xtandi, s/p cryotherapy x2 for Gleason 4+4 prostate cancer.       Indication for treatment:  Curative, Definitive Radiotherapy concurrent with total androgen blockade (Eligard/Xtandi)      Radiation treatment dates:   12/05/22 - 01/29/23   Site/dose:  1. The prostate, seminal vesicles, and pelvic lymph nodes were initially treated to 45 Gy in 25 fractions of 1.8 Gy  2. The prostate only was boosted to 75 Gy with 15 additional fractions of 2.0 Gy (as documented in provider EOT note)  Pre Treatment IPSS Score: 1 (as documented in the provider consult note)  The patient was not available for call today. Voicemail left using a sign language interpreter.  Patient has a scheduled follow up visit with his urologist, Dr. Berneice Heinrich, on 04/2023 for ongoing surveillance. He was counseled that PSA levels will be drawn in the urology office, and was reassured that additional time is expected to improve bowel and bladder symptoms. He was encouraged to call back with concerns or questions regarding radiation.    Ruel Favors, LPN

## 2023-03-28 DIAGNOSIS — C61 Malignant neoplasm of prostate: Secondary | ICD-10-CM | POA: Diagnosis not present

## 2023-04-02 ENCOUNTER — Telehealth: Payer: Self-pay | Admitting: *Deleted

## 2023-04-02 NOTE — Telephone Encounter (Signed)
Called wife of [name] [facility]) and informed her that this patient will not be seeing Dr. Kathrynn Running any longer, his care has been transferred to Dr. Berneice Heinrich @ [facility], lvm for a return call

## 2023-04-02 NOTE — Telephone Encounter (Signed)
RETURNED PATIENT'S WIFE'S PHONE CALL (RONDA), LVM FOR A RETURN CALL

## 2023-04-04 ENCOUNTER — Encounter: Payer: Self-pay | Admitting: *Deleted

## 2023-04-04 DIAGNOSIS — M818 Other osteoporosis without current pathological fracture: Secondary | ICD-10-CM | POA: Diagnosis not present

## 2023-04-04 DIAGNOSIS — C61 Malignant neoplasm of prostate: Secondary | ICD-10-CM | POA: Diagnosis not present

## 2023-04-15 ENCOUNTER — Encounter: Payer: Self-pay | Admitting: *Deleted

## 2023-04-15 ENCOUNTER — Other Ambulatory Visit: Payer: Self-pay

## 2023-04-15 ENCOUNTER — Inpatient Hospital Stay: Payer: Medicare PPO | Attending: Nurse Practitioner | Admitting: *Deleted

## 2023-04-15 VITALS — BP 129/54 | HR 83 | Temp 98.4°F | Resp 18 | Ht 65.0 in | Wt 145.5 lb

## 2023-04-15 DIAGNOSIS — C61 Malignant neoplasm of prostate: Secondary | ICD-10-CM

## 2023-04-15 NOTE — Progress Notes (Addendum)
Pt was seen today for Survivorship appt. Pt presents today with sign language interpreter. Pt's vitals were WNL. Patient denies pain today. Pt does not smoke nor drink. He says he has some fatigue but able to get things done. He rates a 4/10. Pt does not have urgency any more and only gets up twice at night. Pt does not have any hotflashes despite taking the Eligard. Bowels are moving regularly. Currently pt doesn't exercise but I encourage him to at least start walking a few days a week for about 30 minutes. He felt like he could give that a try. Vaccinations discussed and updated. Pt will see PCP in July. Colonoscopy due 04/2024. Last PSA was 1.03 in May. He will see Dr. Berneice Heinrich in August for injection and PSA blood draw.

## 2023-06-24 DIAGNOSIS — H524 Presbyopia: Secondary | ICD-10-CM | POA: Diagnosis not present

## 2023-06-24 DIAGNOSIS — H25813 Combined forms of age-related cataract, bilateral: Secondary | ICD-10-CM | POA: Diagnosis not present

## 2023-06-24 DIAGNOSIS — H02831 Dermatochalasis of right upper eyelid: Secondary | ICD-10-CM | POA: Diagnosis not present

## 2023-07-04 DIAGNOSIS — C61 Malignant neoplasm of prostate: Secondary | ICD-10-CM | POA: Diagnosis not present

## 2023-07-11 DIAGNOSIS — M818 Other osteoporosis without current pathological fracture: Secondary | ICD-10-CM | POA: Diagnosis not present

## 2023-07-11 DIAGNOSIS — C61 Malignant neoplasm of prostate: Secondary | ICD-10-CM | POA: Diagnosis not present

## 2023-07-25 DIAGNOSIS — Z13220 Encounter for screening for lipoid disorders: Secondary | ICD-10-CM | POA: Diagnosis not present

## 2023-07-25 DIAGNOSIS — Z79899 Other long term (current) drug therapy: Secondary | ICD-10-CM | POA: Diagnosis not present

## 2023-07-25 DIAGNOSIS — R946 Abnormal results of thyroid function studies: Secondary | ICD-10-CM | POA: Diagnosis not present

## 2023-07-25 DIAGNOSIS — Z125 Encounter for screening for malignant neoplasm of prostate: Secondary | ICD-10-CM | POA: Diagnosis not present

## 2023-07-25 DIAGNOSIS — R7309 Other abnormal glucose: Secondary | ICD-10-CM | POA: Diagnosis not present

## 2023-08-01 DIAGNOSIS — Z0000 Encounter for general adult medical examination without abnormal findings: Secondary | ICD-10-CM | POA: Diagnosis not present

## 2023-08-01 DIAGNOSIS — E118 Type 2 diabetes mellitus with unspecified complications: Secondary | ICD-10-CM | POA: Diagnosis not present

## 2023-08-01 DIAGNOSIS — I10 Essential (primary) hypertension: Secondary | ICD-10-CM | POA: Diagnosis not present

## 2023-08-01 DIAGNOSIS — D509 Iron deficiency anemia, unspecified: Secondary | ICD-10-CM | POA: Diagnosis not present

## 2023-08-01 DIAGNOSIS — E785 Hyperlipidemia, unspecified: Secondary | ICD-10-CM | POA: Diagnosis not present

## 2023-08-01 DIAGNOSIS — H35039 Hypertensive retinopathy, unspecified eye: Secondary | ICD-10-CM | POA: Diagnosis not present

## 2023-08-01 DIAGNOSIS — K2270 Barrett's esophagus without dysplasia: Secondary | ICD-10-CM | POA: Diagnosis not present

## 2024-01-09 DIAGNOSIS — C61 Malignant neoplasm of prostate: Secondary | ICD-10-CM | POA: Diagnosis not present

## 2024-01-09 DIAGNOSIS — M818 Other osteoporosis without current pathological fracture: Secondary | ICD-10-CM | POA: Diagnosis not present

## 2024-01-23 DIAGNOSIS — D509 Iron deficiency anemia, unspecified: Secondary | ICD-10-CM | POA: Diagnosis not present

## 2024-01-23 DIAGNOSIS — E118 Type 2 diabetes mellitus with unspecified complications: Secondary | ICD-10-CM | POA: Diagnosis not present

## 2024-01-23 DIAGNOSIS — I10 Essential (primary) hypertension: Secondary | ICD-10-CM | POA: Diagnosis not present

## 2024-01-30 DIAGNOSIS — C61 Malignant neoplasm of prostate: Secondary | ICD-10-CM | POA: Diagnosis not present

## 2024-01-30 DIAGNOSIS — K2270 Barrett's esophagus without dysplasia: Secondary | ICD-10-CM | POA: Diagnosis not present

## 2024-01-30 DIAGNOSIS — D509 Iron deficiency anemia, unspecified: Secondary | ICD-10-CM | POA: Diagnosis not present

## 2024-01-30 DIAGNOSIS — I10 Essential (primary) hypertension: Secondary | ICD-10-CM | POA: Diagnosis not present

## 2024-01-30 DIAGNOSIS — R946 Abnormal results of thyroid function studies: Secondary | ICD-10-CM | POA: Diagnosis not present

## 2024-01-30 DIAGNOSIS — E785 Hyperlipidemia, unspecified: Secondary | ICD-10-CM | POA: Diagnosis not present

## 2024-01-30 DIAGNOSIS — E118 Type 2 diabetes mellitus with unspecified complications: Secondary | ICD-10-CM | POA: Diagnosis not present

## 2024-06-29 DIAGNOSIS — H02832 Dermatochalasis of right lower eyelid: Secondary | ICD-10-CM | POA: Diagnosis not present

## 2024-06-29 DIAGNOSIS — H524 Presbyopia: Secondary | ICD-10-CM | POA: Diagnosis not present

## 2024-06-29 DIAGNOSIS — H02831 Dermatochalasis of right upper eyelid: Secondary | ICD-10-CM | POA: Diagnosis not present

## 2024-06-29 DIAGNOSIS — H1132 Conjunctival hemorrhage, left eye: Secondary | ICD-10-CM | POA: Diagnosis not present

## 2024-06-29 DIAGNOSIS — H25813 Combined forms of age-related cataract, bilateral: Secondary | ICD-10-CM | POA: Diagnosis not present

## 2024-07-16 DIAGNOSIS — C61 Malignant neoplasm of prostate: Secondary | ICD-10-CM | POA: Diagnosis not present

## 2024-07-23 DIAGNOSIS — C61 Malignant neoplasm of prostate: Secondary | ICD-10-CM | POA: Diagnosis not present

## 2024-07-23 DIAGNOSIS — M818 Other osteoporosis without current pathological fracture: Secondary | ICD-10-CM | POA: Diagnosis not present

## 2024-07-28 DIAGNOSIS — D509 Iron deficiency anemia, unspecified: Secondary | ICD-10-CM | POA: Diagnosis not present

## 2024-07-28 DIAGNOSIS — R946 Abnormal results of thyroid function studies: Secondary | ICD-10-CM | POA: Diagnosis not present

## 2024-07-28 DIAGNOSIS — E118 Type 2 diabetes mellitus with unspecified complications: Secondary | ICD-10-CM | POA: Diagnosis not present

## 2024-07-28 DIAGNOSIS — E785 Hyperlipidemia, unspecified: Secondary | ICD-10-CM | POA: Diagnosis not present

## 2024-08-04 DIAGNOSIS — Z0000 Encounter for general adult medical examination without abnormal findings: Secondary | ICD-10-CM | POA: Diagnosis not present

## 2024-08-04 DIAGNOSIS — D509 Iron deficiency anemia, unspecified: Secondary | ICD-10-CM | POA: Diagnosis not present

## 2024-08-04 DIAGNOSIS — K2270 Barrett's esophagus without dysplasia: Secondary | ICD-10-CM | POA: Diagnosis not present

## 2024-08-04 DIAGNOSIS — H35039 Hypertensive retinopathy, unspecified eye: Secondary | ICD-10-CM | POA: Diagnosis not present

## 2024-08-04 DIAGNOSIS — E785 Hyperlipidemia, unspecified: Secondary | ICD-10-CM | POA: Diagnosis not present

## 2024-08-04 DIAGNOSIS — I10 Essential (primary) hypertension: Secondary | ICD-10-CM | POA: Diagnosis not present

## 2024-08-04 DIAGNOSIS — E118 Type 2 diabetes mellitus with unspecified complications: Secondary | ICD-10-CM | POA: Diagnosis not present
